# Patient Record
Sex: Male | Born: 1980 | Hispanic: Yes | Smoking: Never smoker
Health system: Southern US, Community
[De-identification: ages and names within clinical notes are randomized; demographics above are authoritative.]

## PROBLEM LIST (undated history)

## (undated) DIAGNOSIS — F32A Depression, unspecified: Secondary | ICD-10-CM

## (undated) DIAGNOSIS — R569 Unspecified convulsions: Secondary | ICD-10-CM

## (undated) DIAGNOSIS — G40909 Epilepsy, unspecified, not intractable, without status epilepticus: Secondary | ICD-10-CM

## (undated) HISTORY — PX: BRAIN SURGERY: SHX531

---

## 2020-07-10 NOTE — Congregational Nurse Program (Signed)
Patient needs assistance with obtaining a medication refill of Vimpat 100mg .He recently moved from [place], [place] a month ago. Shared with him the resource of the mobile medical unit to be seen there and obtain a prescription in state. Pt has medicaid from [place] however this insurance does not cover him here in [place].

## 2020-07-11 ENCOUNTER — Emergency Department (HOSPITAL_BASED_OUTPATIENT_CLINIC_OR_DEPARTMENT_OTHER): Payer: Self-pay

## 2020-07-11 ENCOUNTER — Emergency Department (HOSPITAL_BASED_OUTPATIENT_CLINIC_OR_DEPARTMENT_OTHER)
Admission: EM | Admit: 2020-07-11 | Discharge: 2020-07-12 | Disposition: A | Discharge status: Home / Self Care | Payer: Self-pay | Attending: Emergency Medicine | Admitting: Emergency Medicine

## 2020-07-11 ENCOUNTER — Other Ambulatory Visit: Payer: Self-pay

## 2020-07-11 ENCOUNTER — Encounter (HOSPITAL_BASED_OUTPATIENT_CLINIC_OR_DEPARTMENT_OTHER): Payer: Self-pay | Admitting: *Deleted

## 2020-07-11 DIAGNOSIS — G40909 Epilepsy, unspecified, not intractable, without status epilepticus: Secondary | ICD-10-CM

## 2020-07-11 DIAGNOSIS — Z760 Encounter for issue of repeat prescription: Secondary | ICD-10-CM

## 2020-07-11 DIAGNOSIS — Z20822 Contact with and (suspected) exposure to covid-19: Secondary | ICD-10-CM | POA: Insufficient documentation

## 2020-07-11 DIAGNOSIS — Q046 Congenital cerebral cysts: Secondary | ICD-10-CM

## 2020-07-11 DIAGNOSIS — Z79899 Other long term (current) drug therapy: Secondary | ICD-10-CM | POA: Insufficient documentation

## 2020-07-11 HISTORY — DX: Depression, unspecified: F32.A

## 2020-07-11 HISTORY — DX: Epilepsy, unspecified, not intractable, without status epilepticus: G40.909

## 2020-07-11 HISTORY — DX: Unspecified convulsions: R56.9

## 2020-07-11 LAB — CBC WITH DIFFERENTIAL/PLATELET
Abs Immature Granulocytes: 0.08 10*3/uL — ABNORMAL HIGH (ref 0.00–0.07)
Basophils Absolute: 0 10*3/uL (ref 0.0–0.1)
Basophils Relative: 0 %
Eosinophils Absolute: 0.2 10*3/uL (ref 0.0–0.5)
Eosinophils Relative: 2 %
HCT: 40 % (ref 39.0–52.0)
Hemoglobin: 12.9 g/dL — ABNORMAL LOW (ref 13.0–17.0)
Immature Granulocytes: 1 %
Lymphocytes Relative: 15 %
Lymphs Abs: 1.6 10*3/uL (ref 0.7–4.0)
MCH: 27.2 pg (ref 26.0–34.0)
MCHC: 32.3 g/dL (ref 30.0–36.0)
MCV: 84.4 fL (ref 80.0–100.0)
Monocytes Absolute: 1 10*3/uL (ref 0.1–1.0)
Monocytes Relative: 10 %
Neutro Abs: 7.9 10*3/uL — ABNORMAL HIGH (ref 1.7–7.7)
Neutrophils Relative %: 72 %
Platelets: 284 10*3/uL (ref 150–400)
RBC: 4.74 MIL/uL (ref 4.22–5.81)
RDW: 12.5 % (ref 11.5–15.5)
WBC: 10.8 10*3/uL — ABNORMAL HIGH (ref 4.0–10.5)
nRBC: 0 % (ref 0.0–0.2)

## 2020-07-11 LAB — BASIC METABOLIC PANEL
Anion gap: 8 (ref 5–15)
BUN: 14 mg/dL (ref 6–20)
CO2: 24 mmol/L (ref 22–32)
Calcium: 8.8 mg/dL — ABNORMAL LOW (ref 8.9–10.3)
Chloride: 103 mmol/L (ref 98–111)
Creatinine, Ser: 1.18 mg/dL (ref 0.61–1.24)
GFR, Estimated: >60 mL/min (ref >60)
Glucose, Bld: 114 mg/dL — ABNORMAL HIGH (ref 70–99)
Potassium: 4 mmol/L (ref 3.5–5.1)
Sodium: 135 mmol/L (ref 135–145)

## 2020-07-11 MED ORDER — LEVETIRACETAM IN NACL 1000 MG/100ML IV SOLN
1000.0000 mg | Freq: Once | INTRAVENOUS | Status: AC
  Administered 2020-07-11: 1000 mg via INTRAVENOUS
  Filled 2020-07-11: qty 100

## 2020-07-11 MED ORDER — LORAZEPAM 2 MG/ML IJ SOLN
INTRAMUSCULAR | Status: AC
  Administered 2020-07-11: 1 mg via INTRAVENOUS
  Filled 2020-07-11: qty 1

## 2020-07-11 MED ORDER — VIMPAT 100 MG PO TABS: 1.0000 | ORAL_TABLET | Freq: Two times a day (BID) | ORAL | 0 refills | Status: DC

## 2020-07-11 MED ORDER — LORAZEPAM 2 MG/ML IJ SOLN: 1.0000 mg | Freq: Once | INTRAMUSCULAR | Status: AC

## 2020-07-11 MED ORDER — LEVETIRACETAM 1000 MG PO TABS: 1000.0000 mg | ORAL_TABLET | Freq: Two times a day (BID) | ORAL | 0 refills | Status: DC

## 2020-07-11 NOTE — ED Notes (Signed)
Family calls nurse to room. Pt with seizure activity. Dr. Nicanor Alcon to room. RT at bedside. Respirations assisted with non-rebreather mask.

## 2020-07-11 NOTE — ED Triage Notes (Addendum)
Using I pad  translator Pt requesting refill of medication , seizure x 1 today d/t out of meds x 2 days

## 2020-07-11 NOTE — ED Provider Notes (Signed)
[facility] EMERGENCY DEPARTMENT Provider Note   CSN: [id number] Arrival date & time: 07/11/20  2210     History Chief Complaint  Patient presents with  . Seizures    Omar Robertson is a 39 y.o. male.  The history is provided by the patient. The history is limited by a language barrier. A language interpreter was used.  Seizures Seizure activity on arrival: no   Seizure type:  Grand mal Initial focality:  None Episode characteristics: abnormal movements   Postictal symptoms: no somnolence   Return to baseline: yes   Severity:  Mild Timing:  Once Progression:  Resolved Context: not alcohol withdrawal   Context comment:  Out of his vimpat because [place]  Recent head injury:  No recent head injuries PTA treatment:  None History of seizures: yes        Past Medical History:  Diagnosis Date  . Depression   . Epilepsy (HCC)   . Seizures (HCC)     There are no problems to display for this patient.   History reviewed. No pertinent surgical history.     No family history on file.  Social History   Tobacco Use  . Smoking status: Never Smoker  Substance Use Topics  . Alcohol use: Not Currently  . Drug use: Not Currently    Home Medications Prior to Admission medications   Medication Sig Start Date End Date Taking? Authorizing Provider  escitalopram (LEXAPRO) 20 MG tablet Take 20 mg by mouth daily.   Yes [provider]  lacosamide (VIMPAT) 50 MG TABS tablet Take 100 mg by mouth 2 (two) times daily.   Yes [provider]  levETIRAcetam (KEPPRA) 1000 MG tablet Take 1,000 mg by mouth 2 (two) times daily.   Yes [provider]  Lacosamide (VIMPAT) 100 MG TABS Take 1 tablet (100 mg total) by mouth in the morning and at bedtime. 07/11/20 08/10/20  Lasya Vetter, MD  levETIRAcetam (KEPPRA) 1000 MG tablet Take 1 tablet (1,000 mg total) by mouth 2 (two) times daily. 07/11/20   Anyelin Mogle, MD     Allergies    Patient has no known allergies.  Review of Systems   Review of Systems  Constitutional: Negative for fever.  HENT: Negative for congestion.   Eyes: Negative for visual disturbance.  Respiratory: Negative for shortness of breath.   Cardiovascular: Negative for chest pain.  Gastrointestinal: Negative for abdominal pain.  Genitourinary: Negative for difficulty urinating.  Musculoskeletal: Negative for back pain.  Skin: Negative for rash.  Neurological: Positive for seizures.  Psychiatric/Behavioral: Negative for agitation.  All other systems reviewed and are negative.   Physical Exam Updated Vital Signs BP 114/83 (BP Location: Left Arm)   Pulse 96   Temp 99.3 F (37.4 C) (Oral)   Resp 18   Ht 5' 6.93" (1.7 m)   Wt 76.2 kg   SpO2 98%   BMI 26.37 kg/m   Physical Exam Vitals and nursing note reviewed.  Constitutional:      General: He is not in acute distress.    Appearance: Normal appearance.  HENT:     Head: Normocephalic and atraumatic.     Nose: Nose normal.  Eyes:     Conjunctiva/sclera: Conjunctivae normal.     Pupils: Pupils are equal, round, and reactive to light.  Cardiovascular:     Rate and Rhythm: Normal rate and regular rhythm.     Pulses: Normal pulses.     Heart sounds:  Normal heart sounds.  Pulmonary:     Effort: Pulmonary effort is normal.     Breath sounds: Normal breath sounds.  Abdominal:     General: Abdomen is flat. Bowel sounds are normal.     Palpations: Abdomen is soft.     Tenderness: There is no abdominal tenderness. There is no guarding or rebound.  Musculoskeletal:        General: Normal range of motion.     Cervical back: Normal range of motion and neck supple.  Skin:    General: Skin is warm and dry.     Capillary Refill: Capillary refill takes less than 2 seconds.  Neurological:     General: No focal deficit present.     Mental Status: He is alert and oriented to person, place, and time.     Deep Tendon  Reflexes: Reflexes normal.  Psychiatric:        Mood and Affect: Mood normal.        Behavior: Behavior normal.     ED Results / Procedures / Treatments   Labs (all labs ordered are listed, but only abnormal results are displayed) Labs Reviewed  CBC WITH DIFFERENTIAL/PLATELET  BASIC METABOLIC PANEL    EKG None  Radiology No results found.  Procedures Procedures (including critical care time)  Medications Ordered in ED Medications  levETIRAcetam (KEPPRA) IVPB 1000 mg/100 mL premix (1,000 mg Intravenous New Bag/Given 07/11/20 2323)    ED Course  I have reviewed the triage vital signs and the nursing notes.  Pertinent labs & imaging results that were available during my care of the patient were reviewed by me and considered in my medical decision making (see chart for details).   One seizure witnessed in the ED, lasting 45 second. This broke with ativan.  Now resting comfortably  135 Case d/w Dr.  Derry Lory of neurology.  Restart vompat and refill keppra.  No indication for admission at this time.  Follow up as an outpatient with neurology and no driving for 6 months.     Patient and wife informed no driving for 6 months or until cleared by neurology.  This was printed on discharge in spanish  Omar Robertson was evaluated in Emergency Department on 07/12/2020 for the symptoms described in the history of present illness. He was evaluated in the context of the global COVID-19 pandemic, which necessitated consideration that the patient might be at risk for infection with the SARS-CoV-2 virus that causes COVID-19. Institutional protocols and algorithms that pertain to the evaluation of patients at risk for COVID-19 are in a state of rapid change based on information released by regulatory bodies including the CDC and federal and state organizations. These policies and algorithms were followed during the patient's care in the ED.  MDM Rules/Calculators/A&P                           Omar Robertson was evaluated in Emergency Department on 07/11/2020 for the symptoms described in the history of present illness. He was evaluated in the context of the global COVID-19 pandemic, which necessitated consideration that the patient might be at risk for infection with the SARS-CoV-2 virus that causes COVID-19. Institutional protocols and algorithms that pertain to the evaluation of patients at risk for COVID-19 are in a state of rapid change based on information released by regulatory bodies including the CDC and federal and state organizations. These policies and algorithms were followed during the patient's  care in the ED.  Final Clinical Impression(s) / ED Diagnoses Final diagnoses:  Seizure disorder (HCC)   Return for intractable cough, coughing up blood,fevers >100.4 unrelieved by medication, shortness of breath, intractable vomiting, chest pain, shortness of breath, weakness,numbness, changes in speech, facial asymmetry,abdominal pain, passing out,Inability to tolerate liquids or food, cough, altered mental status or any concerns. No signs of systemic illness or infection. The patient is nontoxic-appearing on exam and vital signs are within normal limits.   I have reviewed the triage vital signs and the nursing notes. Pertinent labs &imaging results that were available during my care of the patient were reviewed by me and considered in my medical decision making (see chart for details).After history, exam, and medical workup I feel the patient has beenappropriately medically screened and is safe for discharge home. Pertinent diagnoses were discussed with the patient. Patient was given return precautions. Rx / DC Orders ED Discharge Orders         Ordered    levETIRAcetam (KEPPRA) 1000 MG tablet  2 times daily        07/11/20 2323    Lacosamide (VIMPAT) 100 MG TABS  2 times daily        07/11/20 2323           Hayly Litsey, MD 07/12/20 [phone]

## 2020-07-11 NOTE — ED Notes (Signed)
Seizure activity stopped after ativan. Mouth suctioned to remove secretions. Pt is postictal at this time.

## 2020-07-12 ENCOUNTER — Emergency Department (HOSPITAL_BASED_OUTPATIENT_CLINIC_OR_DEPARTMENT_OTHER): Payer: Self-pay

## 2020-07-12 LAB — RESPIRATORY PANEL BY RT PCR (FLU A&B, COVID)
Influenza A by PCR: NEGATIVE
Influenza B by PCR: NEGATIVE
SARS Coronavirus 2 by RT PCR: NEGATIVE

## 2020-07-12 NOTE — ED Notes (Signed)
Patient transported to CT 

## 2020-07-12 NOTE — ED Notes (Signed)
Discharge instructions reviewed with patient and significant other. Instructions for medication, follow up care, and no driving for 6 months or until cleared by neurology explained and repeated back. Pt verbalized understanding. Departs ED at this time in stable condition.

## 2020-07-12 NOTE — Discharge Instructions (Signed)
No conducir durante seis menses o hasta que sea despejada por neurologia

## 2020-07-12 NOTE — ED Notes (Signed)
Pt returned to room. PO challenge begun.

## 2020-07-16 ENCOUNTER — Other Ambulatory Visit: Payer: Self-pay

## 2021-01-21 ENCOUNTER — Other Ambulatory Visit: Payer: Self-pay

## 2021-01-21 ENCOUNTER — Ambulatory Visit: Payer: Self-pay | Admitting: Physician Assistant

## 2021-01-21 VITALS — BP 139/85 | HR 65 | Temp 98.8°F | Resp 18 | Ht 71.0 in | Wt 162.0 lb

## 2021-01-21 DIAGNOSIS — Z114 Encounter for screening for human immunodeficiency virus [HIV]: Secondary | ICD-10-CM

## 2021-01-21 DIAGNOSIS — G40802 Other epilepsy, not intractable, without status epilepticus: Secondary | ICD-10-CM

## 2021-01-21 DIAGNOSIS — S069X9S Unspecified intracranial injury with loss of consciousness of unspecified duration, sequela: Secondary | ICD-10-CM

## 2021-01-21 DIAGNOSIS — Z13228 Encounter for screening for other metabolic disorders: Secondary | ICD-10-CM

## 2021-01-21 DIAGNOSIS — Z6822 Body mass index (BMI) 22.0-22.9, adult: Secondary | ICD-10-CM

## 2021-01-21 DIAGNOSIS — F330 Major depressive disorder, recurrent, mild: Secondary | ICD-10-CM

## 2021-01-21 DIAGNOSIS — Z1159 Encounter for screening for other viral diseases: Secondary | ICD-10-CM

## 2021-01-21 MED ORDER — LACOSAMIDE 50 MG PO TABS
ORAL_TABLET | ORAL | 1 refills | Status: DC
  Filled 2021-01-21: qty 30, fill #0

## 2021-01-21 MED ORDER — LEVETIRACETAM 1000 MG PO TABS
1000.0000 mg | ORAL_TABLET | Freq: Two times a day (BID) | ORAL | 1 refills | Status: DC
  Filled 2021-01-21: qty 60, 30d supply, fill #0

## 2021-01-21 MED ORDER — LACOSAMIDE 100 MG PO TABS
1.0000 | ORAL_TABLET | Freq: Two times a day (BID) | ORAL | 0 refills | Status: DC
  Filled 2021-01-21 – 2021-01-31 (×3): qty 60, 30d supply, fill #0

## 2021-01-21 MED ORDER — ESCITALOPRAM OXALATE 20 MG PO TABS
20.0000 mg | ORAL_TABLET | Freq: Every day | ORAL | 2 refills | Status: DC
  Filled 2021-01-21: qty 30, 30d supply, fill #0

## 2021-01-21 NOTE — Progress Notes (Signed)
Patient denies pain at this time. Patient has eaten today. Patient is requesting refills on medication. Patient reports a recent seizure.

## 2021-01-21 NOTE — Patient Instructions (Signed)
We will call you with your lab results.  Please let us know if there is anything else we can do for you.  Roney Jaffe, PA-C Physician Assistant [facility] [email or web address]    Mantenimiento de la salud en los hombres Health Maintenance, Male Adoptar un estilo de vida saludable y recibir atencin preventiva son importantes para promover la salud y Counsellor. Consulte al mdico sobre:  El esquema adecuado para hacerse pruebas y exmenes peridicos.  Cosas que puede hacer por su cuenta para prevenir enfermedades y [place] sano. Qu debo saber sobre la dieta, el peso y el ejercicio? Consuma una dieta saludable  Consuma una dieta que incluya muchas verduras, frutas, productos lcteos con bajo contenido de [place] y Associate Professor.  No consuma muchos alimentos ricos en grasas slidas, azcares agregados o sodio.   Mantenga un peso saludable El ndice de masa muscular [facility]) es una medida que puede utilizarse para identificar posibles problemas de [place]. Proporciona una estimacin de la grasa corporal basndose en el peso y la altura. Su mdico puede ayudarle a Engineer, site [facility] y a Personnel officer o Pharmacologist un peso saludable. Haga ejercicio con regularidad Haga ejercicio con regularidad. Esta es una de las prcticas ms importantes que puede hacer por su salud. La mayora de los adultos deben seguir estas pautas:  Education officer, environmental, al menos, de actividad fsica por semana. El ejercicio debe aumentar la frecuencia cardaca y Media planner transpirar (ejercicio de intensidad moderada).  Hacer ejercicios de fortalecimiento por lo [employer] por semana. Agregue esto a su plan de ejercicio de intensidad moderada.  Pasar menos tiempo sentados. Incluso la actividad fsica ligera puede ser beneficiosa. Controle sus niveles de colesterol y lpidos en la sangre Comience a realizarse anlisis de lpidos y Oncologist en la sangre a los  20aos y luego reptalos cada 5aos. Es posible que Insurance underwriter los niveles de colesterol con mayor frecuencia si:  Sus niveles de lpidos y colesterol son altos.  Es mayor de 40aos.  Presenta un alto riesgo de padecer enfermedades cardacas. Qu debo saber sobre las pruebas de deteccin del cncer? Muchos tipos de cncer pueden detectarse de manera temprana y, a menudo, pueden prevenirse. Segn su historia clnica y sus antecedentes familiares, es posible que deba realizarse pruebas de deteccin del cncer en diferentes edades. Esto puede incluir pruebas de deteccin de lo siguiente:  Building services engineer.  Cncer de prstata.  Cncer de piel.  Cncer de pulmn. Qu debo saber sobre la enfermedad cardaca, la diabetes y la hipertensin arterial? Presin arterial y enfermedad cardaca  La hipertensin arterial causa enfermedades cardacas y [place] el riesgo de accidente cerebrovascular. Es ms probable que esto se manifieste en las personas que tienen lecturas de presin arterial alta, tienen ascendencia africana o tienen sobrepeso.  Hable con el mdico sobre sus valores de presin arterial deseados.  Hgase controlar la presin arterial: ? Cada 3 a 5 aos si tiene entre 18 y 18 aos. ? Todos los aos si es mayor de [place].  Si tiene entre 65 y 87 aos y es fumador o Insurance underwriter, pregntele al mdico si debe realizarse una prueba de deteccin de aneurisma artico abdominal (AAA) por nica vez. Diabetes Realcese exmenes de deteccin de la diabetes con regularidad. Este anlisis revisa el nivel de azcar en la sangre en [place]. Hgase las pruebas de deteccin:  Cada tresaos despus de los 45aos de edad si tiene un peso normal y un bajo riesgo de padecer diabetes.  Con ms frecuencia  frecuencia y a partir de una edad inferior si tiene sobrepeso o un alto riesgo de padecer diabetes. Qu debo saber sobre la prevencin de infecciones? Hepatitis B Si tiene un riesgo ms alto  de contraer hepatitis B, debe someterse a un examen de deteccin de este virus. Hable con el mdico para averiguar si tiene riesgo de contraer la infeccin por hepatitis B. Hepatitis C Se recomienda un anlisis de sangre para:  Todos los que nacieron entre 1945 y 1965.  Todas las personas que tengan un riesgo de haber contrado hepatitis C. Enfermedades de transmisin sexual (ETS)  Debe realizarse pruebas de deteccin de ITS todos los aos, incluidas la gonorrea y la clamidia, si: ? Es sexualmente activo y es menor de 24aos. ? Es mayor de 24aos, y el mdico le informa que corre riesgo de tener este tipo de infecciones. ? La actividad sexual ha cambiado desde que le hicieron la ltima prueba de deteccin y tiene un riesgo mayor de tener clamidia o gonorrea. Pregntele al mdico si usted tiene riesgo.  Pregntele al mdico si usted tiene un alto riesgo de contraer VIH. El mdico tambin puede recomendarle un medicamento recetado para ayudar a evitar la infeccin por el VIH. Si elige tomar medicamentos para prevenir el VIH, primero debe hacerse los anlisis de deteccin del VIH. Luego debe hacerse anlisis cada 3meses mientras est tomando los medicamentos. Siga estas instrucciones en su casa: Estilo de vida  No consuma ningn producto que contenga nicotina o tabaco, como cigarrillos, cigarrillos electrnicos y tabaco de mascar. Si necesita ayuda para dejar de fumar, consulte al mdico.  No consuma drogas.  No comparta agujas.  Solicite ayuda a su mdico si necesita apoyo o informacin para abandonar las drogas. Consumo de alcohol  No beba alcohol si el mdico se lo prohbe.  Si bebe alcohol: ? Limite la cantidad que consume de 0 a 2 medidas por da. ? Est atento a la cantidad de alcohol que hay en las bebidas que toma. En los [place], una medida equivale a una botella de cerveza de 12oz (355ml), un vaso de vino de 5oz (148ml) o un vaso de una bebida alcohlica de alta  graduacin de 1oz (44ml). Instrucciones generales  Realcese los estudios de rutina de la salud, dentales y de la vista.  Mantngase al da con las vacunas.  Infrmele a su mdico si: ? Se siente deprimido con frecuencia. ? Alguna vez ha sido vctima de maltrato o no se siente seguro en su casa. Resumen  Adoptar un estilo de vida saludable y recibir atencin preventiva son importantes para promover la salud y el bienestar.  Siga las instrucciones del mdico acerca de una dieta saludable, el ejercicio y la realizacin de pruebas o exmenes para detectar enfermedades.  Siga las instrucciones del mdico con respecto al control del colesterol y la presin arterial. Esta informacin no tiene como fin reemplazar el consejo del mdico. Asegrese de hacerle al mdico cualquier pregunta que tenga. Document Revised: 09/08/2018 Document Reviewed: 09/08/2018 Elsevier Patient Education  2021 [employer]vier Inc.    
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
## 2021-01-22 DIAGNOSIS — G40909 Epilepsy, unspecified, not intractable, without status epilepticus: Secondary | ICD-10-CM | POA: Insufficient documentation

## 2021-01-22 DIAGNOSIS — F330 Major depressive disorder, recurrent, mild: Secondary | ICD-10-CM | POA: Insufficient documentation

## 2021-01-22 DIAGNOSIS — Z6822 Body mass index (BMI) 22.0-22.9, adult: Secondary | ICD-10-CM | POA: Insufficient documentation

## 2021-01-22 DIAGNOSIS — S069X9A Unspecified intracranial injury with loss of consciousness of unspecified duration, initial encounter: Secondary | ICD-10-CM | POA: Insufficient documentation

## 2021-01-22 HISTORY — DX: Unspecified intracranial injury with loss of consciousness of unspecified duration, initial encounter: S06.9X9A

## 2021-01-22 LAB — COMP. METABOLIC PANEL (12)
AST: 17 IU/L (ref 0–40)
Albumin/Globulin Ratio: 1.6 (ref 1.2–2.2)
Albumin: 5.3 g/dL — ABNORMAL HIGH (ref 4.0–5.0)
Alkaline Phosphatase: 73 IU/L (ref 44–121)
BUN/Creatinine Ratio: 13 (ref 9–20)
BUN: 13 mg/dL (ref 6–24)
Bilirubin Total: 0.2 mg/dL (ref 0.0–1.2)
Calcium: 9.9 mg/dL (ref 8.7–10.2)
Chloride: 102 mmol/L (ref 96–106)
Creatinine, Ser: 0.99 mg/dL (ref 0.76–1.27)
Globulin, Total: 3.3 g/dL (ref 1.5–4.5)
Glucose: 99 mg/dL (ref 65–99)
Potassium: 4.6 mmol/L (ref 3.5–5.2)
Sodium: 142 mmol/L (ref 134–144)
Total Protein: 8.6 g/dL — ABNORMAL HIGH (ref 6.0–8.5)
eGFR: 98 mL/min/{1.73_m2} (ref >59)

## 2021-01-22 LAB — CBC WITH DIFFERENTIAL/PLATELET
Basophils Absolute: 0 10*3/uL (ref 0.0–0.2)
Basos: 0 %
EOS (ABSOLUTE): 0.1 10*3/uL (ref 0.0–0.4)
Eos: 1 %
Hematocrit: 45.3 % (ref 37.5–51.0)
Hemoglobin: 14.3 g/dL (ref 13.0–17.7)
Immature Grans (Abs): 0 10*3/uL (ref 0.0–0.1)
Immature Granulocytes: 0 %
Lymphocytes Absolute: 2.6 10*3/uL (ref 0.7–3.1)
Lymphs: 36 %
MCH: 27 pg (ref 26.6–33.0)
MCHC: 31.6 g/dL (ref 31.5–35.7)
MCV: 86 fL (ref 79–97)
Monocytes Absolute: 0.6 10*3/uL (ref 0.1–0.9)
Monocytes: 7 %
Neutrophils Absolute: 4.1 10*3/uL (ref 1.4–7.0)
Neutrophils: 56 %
Platelets: 280 10*3/uL (ref 150–450)
RBC: 5.3 x10E6/uL (ref 4.14–5.80)
RDW: 13.6 % (ref 11.6–15.4)
WBC: 7.4 10*3/uL (ref 3.4–10.8)

## 2021-01-22 LAB — LIPID PANEL
Chol/HDL Ratio: 4.6 ratio (ref 0.0–5.0)
Cholesterol, Total: 217 mg/dL — ABNORMAL HIGH (ref 100–199)
HDL: 47 mg/dL (ref >39)
LDL Chol Calc (NIH): 146 mg/dL — ABNORMAL HIGH (ref 0–99)
Triglycerides: 133 mg/dL (ref 0–149)
VLDL Cholesterol Cal: 24 mg/dL (ref 5–40)

## 2021-01-22 LAB — TSH: TSH: 1.32 u[IU]/mL (ref 0.450–4.500)

## 2021-01-22 LAB — HCV AB W REFLEX TO QUANT PCR: HCV Ab: <0.1 s/co ratio (ref 0.0–0.9)

## 2021-01-22 LAB — HCV INTERPRETATION

## 2021-01-22 LAB — HIV ANTIBODY (ROUTINE TESTING W REFLEX): HIV Screen 4th Generation wRfx: NONREACTIVE

## 2021-01-24 ENCOUNTER — Telehealth: Payer: Self-pay | Admitting: *Deleted

## 2021-01-24 ENCOUNTER — Other Ambulatory Visit: Payer: Self-pay

## 2021-01-24 NOTE — Telephone Encounter (Signed)
Error - duplicate

## 2021-01-24 NOTE — Telephone Encounter (Signed)
Medical Assistant used [employer] to contact patient.  Interpreter Name: Marylynn Pearson Interpreter #: [id number] Patient verified DOB Patient is aware of thyroid and kidney being normal with liver enzymes needing to be monitored. Patient is aware of needing to take his cholesterol medication for elevated levels with a recheck being completed at the next visit along with liver enzymes.

## 2021-01-24 NOTE — Telephone Encounter (Signed)
Interpreter Name: Marylynn Pearson Interpreter #: [id number] Medical Assistant used [employer] to contact patient.  Patient was not available, [employer] left patient a voicemail. Voicemail states to give a call back to [place] with [facility] AT [phone].

## 2021-01-24 NOTE — Telephone Encounter (Signed)
-----   Message from Roney Jaffe, [place] sent at 01/22/2021 11:56 AM EDT ----- Please call patient and let him know that his thyroid function and kidney function are within normal limits.  He does have a liver marker that is slightly elevated, this will continue to be monitored.  His screening for hepatitis C and HIV were negative, he does not show signs of anemia. His cholesterol is elevated, he does not need to start medication at this time, however it is very important that he follow a low-cholesterol diet and continue to have these levels monitored.  The 10-year ASCVD risk score Denman George DC Montez Hageman., et al., 2013) is: 1.9%   Values used to calculate the score:     Age: 40 years     Sex: Male     Is Non-Hispanic African American: No     Diabetic: No     Tobacco smoker: No     Systolic Blood Pressure: 139 mmHg     Is BP treated: No     HDL Cholesterol: 47 mg/dL     Total Cholesterol: 217 mg/dL

## 2021-01-25 NOTE — Telephone Encounter (Signed)
Rx needs to be sent to another retail pharmacy. We cannot fill because it is controlled.

## 2021-01-28 ENCOUNTER — Emergency Department (HOSPITAL_BASED_OUTPATIENT_CLINIC_OR_DEPARTMENT_OTHER): Payer: Self-pay

## 2021-01-28 ENCOUNTER — Other Ambulatory Visit: Payer: Self-pay

## 2021-01-28 ENCOUNTER — Inpatient Hospital Stay (HOSPITAL_BASED_OUTPATIENT_CLINIC_OR_DEPARTMENT_OTHER)
Admission: EM | Admit: 2021-01-28 | Discharge: 2021-01-31 | DRG: 101 | Disposition: A | Discharge status: Home / Self Care | Payer: Self-pay | Attending: Internal Medicine | Admitting: Internal Medicine

## 2021-01-28 ENCOUNTER — Encounter (HOSPITAL_BASED_OUTPATIENT_CLINIC_OR_DEPARTMENT_OTHER): Payer: Self-pay | Admitting: *Deleted

## 2021-01-28 DIAGNOSIS — Y92009 Unspecified place in unspecified non-institutional (private) residence as the place of occurrence of the external cause: Secondary | ICD-10-CM

## 2021-01-28 DIAGNOSIS — T50906A Underdosing of unspecified drugs, medicaments and biological substances, initial encounter: Secondary | ICD-10-CM | POA: Diagnosis present

## 2021-01-28 DIAGNOSIS — Z938 Other artificial opening status: Secondary | ICD-10-CM

## 2021-01-28 DIAGNOSIS — G40909 Epilepsy, unspecified, not intractable, without status epilepticus: Principal | ICD-10-CM

## 2021-01-28 DIAGNOSIS — S069X9A Unspecified intracranial injury with loss of consciousness of unspecified duration, initial encounter: Secondary | ICD-10-CM | POA: Diagnosis present

## 2021-01-28 DIAGNOSIS — Z79899 Other long term (current) drug therapy: Secondary | ICD-10-CM

## 2021-01-28 DIAGNOSIS — Z91128 Patient's intentional underdosing of medication regimen for other reason: Secondary | ICD-10-CM

## 2021-01-28 DIAGNOSIS — J9383 Other pneumothorax: Secondary | ICD-10-CM | POA: Diagnosis present

## 2021-01-28 DIAGNOSIS — J939 Pneumothorax, unspecified: Secondary | ICD-10-CM

## 2021-01-28 DIAGNOSIS — G40802 Other epilepsy, not intractable, without status epilepticus: Secondary | ICD-10-CM

## 2021-01-28 DIAGNOSIS — S069X9S Unspecified intracranial injury with loss of consciousness of unspecified duration, sequela: Secondary | ICD-10-CM

## 2021-01-28 DIAGNOSIS — Z20822 Contact with and (suspected) exposure to covid-19: Secondary | ICD-10-CM | POA: Diagnosis present

## 2021-01-28 HISTORY — DX: Pneumothorax, unspecified: J93.9

## 2021-01-28 HISTORY — DX: Unspecified convulsions: R56.9

## 2021-01-28 LAB — CBC WITH DIFFERENTIAL/PLATELET
Abs Immature Granulocytes: 0.11 10*3/uL — ABNORMAL HIGH (ref 0.00–0.07)
Basophils Absolute: 0.1 10*3/uL (ref 0.0–0.1)
Basophils Relative: 0 %
Eosinophils Absolute: 0 10*3/uL (ref 0.0–0.5)
Eosinophils Relative: 0 %
HCT: 47.8 % (ref 39.0–52.0)
Hemoglobin: 15.4 g/dL (ref 13.0–17.0)
Immature Granulocytes: 1 %
Lymphocytes Relative: 13 %
Lymphs Abs: 2.3 10*3/uL (ref 0.7–4.0)
MCH: 27.9 pg (ref 26.0–34.0)
MCHC: 32.2 g/dL (ref 30.0–36.0)
MCV: 86.6 fL (ref 80.0–100.0)
Monocytes Absolute: 2.1 10*3/uL — ABNORMAL HIGH (ref 0.1–1.0)
Monocytes Relative: 12 %
Neutro Abs: 12.2 10*3/uL — ABNORMAL HIGH (ref 1.7–7.7)
Neutrophils Relative %: 74 %
Platelets: 319 10*3/uL (ref 150–400)
RBC: 5.52 MIL/uL (ref 4.22–5.81)
RDW: 12.9 % (ref 11.5–15.5)
WBC: 16.7 10*3/uL — ABNORMAL HIGH (ref 4.0–10.5)
nRBC: 0 % (ref 0.0–0.2)

## 2021-01-28 LAB — COMPREHENSIVE METABOLIC PANEL
ALT: 18 U/L (ref 0–44)
AST: 21 U/L (ref 15–41)
Albumin: 5.2 g/dL — ABNORMAL HIGH (ref 3.5–5.0)
Alkaline Phosphatase: 59 U/L (ref 38–126)
Anion gap: 11 (ref 5–15)
BUN: 20 mg/dL (ref 6–20)
CO2: 20 mmol/L — ABNORMAL LOW (ref 22–32)
Calcium: 9.7 mg/dL (ref 8.9–10.3)
Chloride: 107 mmol/L (ref 98–111)
Creatinine, Ser: 1.55 mg/dL — ABNORMAL HIGH (ref 0.61–1.24)
GFR, Estimated: 57 mL/min — ABNORMAL LOW (ref >60)
Glucose, Bld: 119 mg/dL — ABNORMAL HIGH (ref 70–99)
Potassium: 4 mmol/L (ref 3.5–5.1)
Sodium: 138 mmol/L (ref 135–145)
Total Bilirubin: 0.5 mg/dL (ref 0.3–1.2)
Total Protein: 9.4 g/dL — ABNORMAL HIGH (ref 6.5–8.1)

## 2021-01-28 LAB — MAGNESIUM: Magnesium: 2.5 mg/dL — ABNORMAL HIGH (ref 1.7–2.4)

## 2021-01-28 LAB — RESP PANEL BY RT-PCR (FLU A&B, COVID) ARPGX2
Influenza A by PCR: NEGATIVE
Influenza B by PCR: NEGATIVE
SARS Coronavirus 2 by RT PCR: NEGATIVE

## 2021-01-28 LAB — ETHANOL: Alcohol, Ethyl (B): <10 mg/dL (ref <10)

## 2021-01-28 LAB — CBG MONITORING, ED: Glucose-Capillary: 111 mg/dL — ABNORMAL HIGH (ref 70–99)

## 2021-01-28 MED ORDER — LACOSAMIDE 50 MG PO TABS
150.0000 mg | ORAL_TABLET | Freq: Every day | ORAL | Status: DC
  Administered 2021-01-29 – 2021-01-31 (×3): 150 mg via ORAL
  Filled 2021-01-28 (×3): qty 3

## 2021-01-28 MED ORDER — LEVETIRACETAM 500 MG PO TABS
1000.0000 mg | ORAL_TABLET | Freq: Two times a day (BID) | ORAL | Status: DC
  Administered 2021-01-29 – 2021-01-31 (×5): 1000 mg via ORAL
  Filled 2021-01-28 (×5): qty 2

## 2021-01-28 MED ORDER — LORAZEPAM 2 MG/ML IJ SOLN: 2.0000 mg | Freq: Four times a day (QID) | INTRAMUSCULAR | Status: DC | PRN

## 2021-01-28 MED ORDER — LACOSAMIDE 50 MG PO TABS: 50.0000 mg | ORAL_TABLET | Freq: Every day | ORAL | Status: DC

## 2021-01-28 MED ORDER — LORAZEPAM 2 MG/ML IJ SOLN
1.0000 mg | Freq: Once | INTRAMUSCULAR | Status: DC
  Filled 2021-01-28: qty 1

## 2021-01-28 MED ORDER — ACETAMINOPHEN 650 MG RE SUPP: 650.0000 mg | Freq: Four times a day (QID) | RECTAL | Status: DC | PRN

## 2021-01-28 MED ORDER — ESCITALOPRAM OXALATE 20 MG PO TABS
20.0000 mg | ORAL_TABLET | Freq: Every day | ORAL | Status: DC
  Administered 2021-01-29 – 2021-01-31 (×3): 20 mg via ORAL
  Filled 2021-01-28 (×3): qty 1

## 2021-01-28 MED ORDER — LACOSAMIDE 50 MG PO TABS
50.0000 mg | ORAL_TABLET | Freq: Every day | ORAL | Status: DC
Start: 2021-01-29 — End: 2021-01-28

## 2021-01-28 MED ORDER — LACOSAMIDE 50 MG PO TABS
100.0000 mg | ORAL_TABLET | Freq: Two times a day (BID) | ORAL | Status: DC
  Administered 2021-01-28: 100 mg via ORAL
  Filled 2021-01-28 (×2): qty 2

## 2021-01-28 MED ORDER — LACOSAMIDE 50 MG PO TABS
100.0000 mg | ORAL_TABLET | Freq: Every day | ORAL | Status: DC
  Administered 2021-01-29 – 2021-01-30 (×2): 100 mg via ORAL
  Filled 2021-01-28 (×2): qty 2

## 2021-01-28 MED ORDER — LEVETIRACETAM 500 MG PO TABS: 1000.0000 mg | ORAL_TABLET | Freq: Two times a day (BID) | ORAL | Status: DC

## 2021-01-28 MED ORDER — ACETAMINOPHEN 325 MG PO TABS
650.0000 mg | ORAL_TABLET | Freq: Four times a day (QID) | ORAL | Status: DC | PRN
  Administered 2021-01-29: 650 mg via ORAL
  Filled 2021-01-28: qty 2

## 2021-01-28 MED ORDER — ONDANSETRON HCL 4 MG PO TABS: 4.0000 mg | ORAL_TABLET | Freq: Four times a day (QID) | ORAL | Status: DC | PRN

## 2021-01-28 MED ORDER — ENOXAPARIN SODIUM 40 MG/0.4ML IJ SOSY
40.0000 mg | PREFILLED_SYRINGE | INTRAMUSCULAR | Status: DC
  Administered 2021-01-29 – 2021-01-31 (×3): 40 mg via SUBCUTANEOUS
  Filled 2021-01-28 (×3): qty 0.4

## 2021-01-28 MED ORDER — LACOSAMIDE 50 MG PO TABS: 100.0000 mg | ORAL_TABLET | Freq: Two times a day (BID) | ORAL | Status: DC

## 2021-01-28 MED ORDER — LEVETIRACETAM IN NACL 1000 MG/100ML IV SOLN
1000.0000 mg | Freq: Once | INTRAVENOUS | Status: AC
  Administered 2021-01-29: 1000 mg via INTRAVENOUS
  Filled 2021-01-28 (×2): qty 100

## 2021-01-28 MED ORDER — SODIUM CHLORIDE 0.9 % IV SOLN
100.0000 mg | Freq: Once | INTRAVENOUS | Status: AC
  Administered 2021-01-29: 100 mg via INTRAVENOUS
  Filled 2021-01-28: qty 10

## 2021-01-28 MED ORDER — ONDANSETRON HCL 4 MG/2ML IJ SOLN: 4.0000 mg | Freq: Four times a day (QID) | INTRAMUSCULAR | Status: DC | PRN

## 2021-01-28 MED ORDER — LORAZEPAM 2 MG/ML IJ SOLN
1.0000 mg | Freq: Once | INTRAMUSCULAR | Status: AC
  Administered 2021-01-28: 1 mg via INTRAVENOUS

## 2021-01-28 NOTE — ED Provider Notes (Signed)
[facility] EMERGENCY DEPARTMENT Provider Note   CSN: [id number] Arrival date & time: 01/28/21  1759     History Chief Complaint  Patient presents with  . Altered Mental Status    Hulan FessJandrel A Majano is a 40 y.o. male w/ pmhx of brain cyst s/p surgical removal (?) presenting to emergency department company of his wife with concern for seizures.  The patient is primarily Spanish-speaking, translator was used.  History is supplemented by his wife.  She reports that the patient has a history of seizures for many years, is normally on Keppra 1000 mg twice daily as well as Vimpat 100 mg twice daily.  He ran out of Vimpat 3 days ago.  He has been having increasing seizures since running out of the medications.  She reports has been having a seizure every 5 hours for the past 3 days.  Most recent seizure was approximately 1 hour prior to arrival.  These will last about 3 to 4 minutes with generalized tonic-clonic jerking, with a postictal state.  Patient is currently back to his baseline mental status.  He has no acute complaints.  He denies headache.  He denies urinary incontinence or biting his tongue.  He denies muscle aches.  His wife also reports concerns that he has been behaving erratically.  She reports he does have a history of mental health problems, but this is stabilized on the Vimpat.  The patient currently denies any thoughts of harming himself, any suicidal ideations.  [place] are [place].  They moved here several months ago to be closer to other family.  They do not currently have a neurologist here.  HPI     Past Medical History:  Diagnosis Date  . Depression   . Epilepsy (HCC)   . Seizure (HCC)   . Seizures [facility](HCC)     Patient Active Problem List   Diagnosis Date Noted  . Recurrent seizures (HCC) 01/28/2021  . Pneumothorax 01/28/2021  . Epilepsy (HCC) 01/22/2021  . Traumatic brain injury with loss of consciousness (HCC) 01/22/2021  . Mild episode of  recurrent major depressive disorder (HCC) 01/22/2021  . Body mass index (BMI) 22.0-22.9, adult 01/22/2021    Past Surgical History:  Procedure Laterality Date  . BRAIN SURGERY         Family History  Problem Relation Age of Onset  . Seizures Neg Hx     Social History   Tobacco Use  . Smoking status: Never Smoker  . Smokeless tobacco: Never Used  Substance Use Topics  . Alcohol use: Not Currently  . Drug use: Not Currently    Home Medications Prior to Admission medications   Medication Sig Start Date End Date Taking? Authorizing Provider  escitalopram (LEXAPRO) 20 MG tablet Take 1 tablet (20 mg total) by mouth daily. 01/21/21   Mayers, Cari S, PA-C  Lacosamide (VIMPAT) 100 MG TABS Take 1 tablet (100 mg total) by mouth in the morning and at bedtime. 01/21/21 02/20/21  Mayers, Kasandra Knudsenari S, PA-C  lacosamide (VIMPAT) 50 MG TABS tablet Take 1 tab PO qAM along with 100 mg tab once daily 01/21/21   Mayers, Cari S, PA-C  levETIRAcetam (KEPPRA) 1000 MG tablet Take 1 tablet (1,000 mg total) by mouth 2 (two) times daily. 01/21/21   Mayers, Cari S, PA-C    Allergies    Patient has no known allergies.  Review of Systems   Review of Systems  Constitutional: Negative for chills and fever.  Eyes: Negative for pain  and visual disturbance.  Respiratory: Negative for cough and shortness of breath.   Cardiovascular: Positive for chest pain. Negative for palpitations.  Gastrointestinal: Negative for abdominal pain and vomiting.  Genitourinary: Negative for dysuria and hematuria.  Musculoskeletal: Negative for arthralgias and myalgias.  Skin: Negative for color change and rash.  Neurological: Positive for seizures. Negative for syncope, light-headedness and headaches.  All other systems reviewed and are negative.   Physical Exam Updated Vital Signs BP (!) 143/86 (BP Location: Right Arm)   Pulse 76   Temp 98.2 F (36.8 C) (Oral)   Resp 18   Ht 5\' 11"  (1.803 m)   Wt 72.6 kg   SpO2 98%    BMI 22.32 kg/m   Physical Exam Constitutional:      General: He is not in acute distress. HENT:     Head: Normocephalic and atraumatic.  Eyes:     Conjunctiva/sclera: Conjunctivae normal.     Pupils: Pupils are equal, round, and reactive to light.  Cardiovascular:     Rate and Rhythm: Regular rhythm. Tachycardia present.     Comments: HR 100 bpm Pulmonary:     Effort: Pulmonary effort is normal. No respiratory distress.     Breath sounds: No wheezing.     Comments: Diminished breath sounds in left apex 99% on room air Chest:     Chest wall: No tenderness.  Abdominal:     General: There is no distension.     Tenderness: There is no abdominal tenderness.  Skin:    General: Skin is warm and dry.  Neurological:     General: No focal deficit present.     Mental Status: He is alert and oriented to person, place, and time. Mental status is at baseline.     Sensory: No sensory deficit.     Motor: No weakness.     ED Results / Procedures / Treatments   Labs (all labs ordered are listed, but only abnormal results are displayed) Labs Reviewed  CBC WITH DIFFERENTIAL/PLATELET - Abnormal; Notable for the following components:      Result Value   WBC 16.7 (*)    Neutro Abs 12.2 (*)    Monocytes Absolute 2.1 (*)    Abs Immature Granulocytes 0.11 (*)    All other components within normal limits  MAGNESIUM - Abnormal; Notable for the following components:   Magnesium 2.5 (*)    All other components within normal limits  COMPREHENSIVE METABOLIC PANEL - Abnormal; Notable for the following components:   CO2 20 (*)    Glucose, Bld 119 (*)    Creatinine, Ser 1.55 (*)    Total Protein 9.4 (*)    Albumin 5.2 (*)    GFR, Estimated 57 (*)    All other components within normal limits  BASIC METABOLIC PANEL - Abnormal; Notable for the following components:   Glucose, Bld 109 (*)    All other components within normal limits  CBG MONITORING, ED - Abnormal; Notable for the following  components:   Glucose-Capillary 111 (*)    All other components within normal limits  RESP PANEL BY RT-PCR (FLU A&B, COVID) ARPGX2  ETHANOL  URINALYSIS, ROUTINE W REFLEX MICROSCOPIC  RAPID URINE DRUG SCREEN, HOSP PERFORMED    EKG None  Radiology DG CHEST PORT 1 VIEW  Result Date: 01/29/2021 CLINICAL DATA:  Pneumothorax. EXAM: PORTABLE CHEST 1 VIEW COMPARISON:  01/28/2021. FINDINGS: Mediastinum hilar structures normal. Stable cardiomegaly. Low lung volumes with bibasilar atelectasis. No pleural effusion. Moderate left-sided  pneumothorax is again noted without interim change. IMPRESSION: 1. Moderate left-sided pneumothorax again noted without interim change. 2.  Low lung volumes with bibasilar atelectasis. 3.  Stable cardiomegaly. Electronically Signed   By: Maisie Fus  Register   On: 01/29/2021 07:15   DG Chest Portable 1 View  Result Date: 01/28/2021 CLINICAL DATA:  Repeat radiograph, assess left-sided pneumothorax EXAM: PORTABLE CHEST 1 VIEW COMPARISON:  01/28/2021, 6:38 p.m. FINDINGS: The heart size and mediastinal contours are within normal limits. Unchanged small to moderate left pneumothorax, approximately 20% volume. The right lung is normally aerated. The visualized skeletal structures are unremarkable. IMPRESSION: Unchanged small to moderate left pneumothorax, approximately 20% volume. The right lung is normally aerated. Electronically Signed   By: Lauralyn Primes M.D.   On: 01/28/2021 20:41   DG Chest Port 1 View  Result Date: 01/28/2021 CLINICAL DATA:  Recent seizure activity EXAM: PORTABLE CHEST 1 VIEW COMPARISON:  07/11/2020 FINDINGS: Cardiac shadow is within normal limits. Right lung is well aerated. Left lung demonstrates a moderate-sized pneumothorax with approximately 4.8 cm of excursion from the apex. No focal infiltrate is seen. Mild atelectasis is noted in the left base. No bony abnormality is noted. IMPRESSION: New left pneumothorax as described. Critical Value/emergent results  were called by telephone at the time of interpretation on 01/28/2021 at 7:25 pm to Dr. Alvester Chou , who verbally acknowledged these results. Electronically Signed   By: Alcide Clever M.D.   On: 01/28/2021 19:25    Procedures Procedures   Medications Ordered in ED Medications  escitalopram (LEXAPRO) tablet 20 mg (20 mg Oral Given 01/29/21 0906)  LORazepam (ATIVAN) injection 2 mg (has no administration in time range)  levETIRAcetam (KEPPRA) tablet 1,000 mg (1,000 mg Oral Given 01/29/21 0906)  lacosamide (VIMPAT) tablet 150 mg (150 mg Oral Given 01/29/21 0905)  lacosamide (VIMPAT) tablet 100 mg (has no administration in time range)  enoxaparin (LOVENOX) injection 40 mg (40 mg Subcutaneous Given 01/29/21 0905)  acetaminophen (TYLENOL) tablet 650 mg (650 mg Oral Given 01/29/21 0129)    Or  acetaminophen (TYLENOL) suppository 650 mg ( Rectal See Alternative 01/29/21 0129)  ondansetron (ZOFRAN) tablet 4 mg (has no administration in time range)    Or  ondansetron (ZOFRAN) injection 4 mg (has no administration in time range)  LORazepam (ATIVAN) injection 1 mg (1 mg Intravenous Given 01/28/21 2022)  levETIRAcetam (KEPPRA) IVPB 1000 mg/100 mL premix (1,000 mg Intravenous New Bag/Given 01/29/21 0225)  lacosamide (VIMPAT) 100 mg in sodium chloride 0.9 % 25 mL IVPB (100 mg Intravenous New Bag/Given 01/29/21 0042)    ED Course  I have reviewed the triage vital signs and the nursing notes.  Pertinent labs & imaging results that were available during my care of the patient were reviewed by me and considered in my medical decision making (see chart for details).  40 yo male here with recurrent seizures in the setting of running out of Vimpat, his long term AED.  He has continued on Keppra 1000 mg BID per his wife's report.  He has a history of "brain surgery for cysts" with CT on 07/12/20 showing post-surgical changes in the right frontal lobe.  This may be contributing to his epilepsy.  There is no reported  head injury or headache today to suggest new brain lesion or ICH as cause of his seizures.  I suspect the seizures are likely related to his medication.  I've ordered vimpat, but we do not have it readily available in our ED, and it will  need to be brought over from [facility].  He took his Keppra this morning.  I performed an infectious w/u to evaluate for other causes of seizures.  No clear nidus of infection in urine or dg chest.  He reports he doesn't think etoh and his levels are negative today.  UDS is pending.  DG chest showed a small left sided PTX.  He has been stable on room air since arrival.  Supplemental O2 provided for therapeutic purposes only, not hypoxia.  Repeat dg chest showed stable PTX.  Unclear if this was traumatic from his seizing or spontaneous or a bleb.  He does appear stable at this time, and I believe this would be amenable for IR management in the hospital as the safest route of treatment, given the relative small size of this PTX.  IV ativan given for anxiety in the ED.  Labs reviewed - Cr elevated to 1.55.  Covid/flu negative.  Mg 2.5.  WBC 16.7.  Spanish translator services used for entire history and exam.  Clinical Course as of 01/29/21 0918  Mon Jan 28, 2021  1927 Left sided PTX noted - I've asked nursing to apply supplemental oxygen.  Patient has no chest pain, no hypotension, no shortness of breath. [MT]  2106 Repeat chest x-ray shows no interval enlargement of the pneumothorax, which remains small to moderate sized at approximately 20%.  He has been placed on supplemental oxygen for supportive care only for the pneumothorax, not for hypoxia or respiratory complaints. [MT]  2106 I spoke to Dr Fredricka Bonine from trauma surgery reports that, with no acute rib fractures, no clear mechanism for trauma, this is less likely a traumatic pneumothorax.  She would advise hospitalist admission given the patient's need for seizure monitoring, and consideration of IR consult for chest  tube. [MT]  2107 I spoke to the patient and the patient's wife using Spanish translator.  Explained the possibility and need for a chest tube.  However, at this point, given that he is clinically stable, I think the safest plan would be for IR-guided tube placement, as a blind approach by myself at the bedside is more likely to lead to incorrect placement. [MT]  2121 I spoke to Dr [facility] hospitalist who accepted patient for admission for progressive bed at [facility]. [MT]    Clinical Course User Index [MT] Dontrell Stuck, Kermit Balo, MD    Final Clinical Impression(s) / ED Diagnoses Final diagnoses:  Pneumothorax    Rx / DC Orders ED Discharge Orders    None       Renaye Rakers Kermit Balo, MD 01/29/21 [phone]
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
## 2021-01-28 NOTE — H&P (Signed)
History and Physical    Omar Robertson LTJ:[id number] DOB: December 09, 1980 DOA: 01/28/2021  PCP: Storm Frisk, MD (Scheduled to establish on 02/28/2021)  Patient coming from: Home  I have personally briefly reviewed patient's old medical records in [facility]  Chief Complaint: Seizures  HPI: Omar Robertson is a 40 y.o. male with medical history significant of brain surgery, seizures for many years.  Pt recently moved to area from [place] several months ago, dont yet have PCP or neurologist in area.  Pt has been having seizures for many years despite treatment with Keppra and Vimpat.  While on meds he usually has 2-3 seizures a month.  However after running out of his Vimpat 3 days ago, he has been having a seizure about every 5 hours for the past 3 days.  These are generalized tonic-clonic seizures with postictal state.  Typically lasts 3-4 mins.  Wife also reports pt behaving erratically, pt does have h/o mental health problems but typically this is stabilized when he is on vimpat (until he ran out of it 3 days ago).  No SI/HI, self harm.  No headache.  Does have L sided CP that onset just today with some associated SOB.   ED Course: Has 20% L sided PTX, stable on repeat CXR in ED a couple hours later.  Pt transferred from [facility] to [facility] for admission.  Given dose of PO vimpat in ED.   Review of Systems: As per HPI, otherwise all review of systems negative.  Past Medical History:  Diagnosis Date  . Depression   . Epilepsy (HCC)   . Seizure (HCC)   . Seizures (HCC)     Past Surgical History:  Procedure Laterality Date  . BRAIN SURGERY       reports that he has never smoked. He has never used smokeless tobacco. He reports previous alcohol use. He reports previous drug use.  No Known Allergies  Family History  Problem Relation Age of Onset  . Seizures Neg Hx      Prior to Admission medications   Medication Sig Start Date End Date Taking? Authorizing  Provider  escitalopram (LEXAPRO) 20 MG tablet Take 1 tablet (20 mg total) by mouth daily. 01/21/21   Mayers, Cari S, PA-C  Lacosamide (VIMPAT) 100 MG TABS Take 1 tablet (100 mg total) by mouth in the morning and at bedtime. 01/21/21 02/20/21  Mayers, Kasandra Knudsen, PA-C  lacosamide (VIMPAT) 50 MG TABS tablet Take 1 tab PO qAM along with 100 mg tab once daily 01/21/21   Mayers, Cari S, PA-C  levETIRAcetam (KEPPRA) 1000 MG tablet Take 1 tablet (1,000 mg total) by mouth 2 (two) times daily. 01/21/21   Mayers, Kasandra Knudsen, PA-C    Physical Exam: Vitals:   01/28/21 2130 01/28/21 2200 01/28/21 2226 01/28/21 2300  BP: (!) 127/91 (!) 126/95 (!) 143/95 (!) 142/92  Pulse: 91 88 90 89  Resp: (!) 23 (!) 26 18 20   Temp:      TempSrc:      SpO2: 100% 100% 100% 100%  Weight:      Height:        Constitutional: NAD, calm, comfortable Eyes: PERRL, lids and conjunctivae normal ENMT: Mucous membranes are moist. Posterior pharynx clear of any exudate or lesions.Normal dentition.  Neck: normal, supple, no masses, no thyromegaly Respiratory: clear to auscultation bilaterally, no wheezing, no crackles. Normal respiratory effort. No accessory muscle use.  Cardiovascular: Regular rate and rhythm, no murmurs / rubs / gallops. No  extremity edema. 2+ pedal pulses. No carotid bruits.  Abdomen: no tenderness, no masses palpated. No hepatosplenomegaly. Bowel sounds positive.  Musculoskeletal: no clubbing / cyanosis. No joint deformity upper and lower extremities. Good ROM, no contractures. Normal muscle tone.  Skin: no rashes, lesions, ulcers. No induration Neurologic: CN 2-12 grossly intact. Sensation intact, DTR normal. Strength 5/5 in all 4.  Psychiatric: Normal judgment and insight. Alert and oriented x 3. Normal mood.    Labs on Admission: I have personally reviewed following labs and imaging studies  CBC: Recent Labs  Lab 01/28/21 1820  WBC 16.7*  NEUTROABS 12.2*  HGB 15.4  HCT 47.8  MCV 86.6  PLT 319   Basic  Metabolic Panel: Recent Labs  Lab 01/28/21 1820  NA 138  K 4.0  CL 107  CO2 20*  GLUCOSE 119*  BUN 20  CREATININE 1.55*  CALCIUM 9.7  MG 2.5*   GFR: Estimated Creatinine Clearance: 64.4 mL/min (A) (by C-G formula based on SCr of 1.55 mg/dL (H)). Liver Function Tests: Recent Labs  Lab 01/28/21 1820  AST 21  ALT 18  ALKPHOS 59  BILITOT 0.5  PROT 9.4*  ALBUMIN 5.2*   No results for input(s): LIPASE, AMYLASE in the last 168 hours. No results for input(s): AMMONIA in the last 168 hours. Coagulation Profile: No results for input(s): INR, PROTIME in the last 168 hours. Cardiac Enzymes: No results for input(s): CKTOTAL, CKMB, CKMBINDEX, TROPONINI in the last 168 hours. BNP (last 3 results) No results for input(s): PROBNP in the last 8760 hours. HbA1C: No results for input(s): HGBA1C in the last 72 hours. CBG: Recent Labs  Lab 01/28/21 1846  GLUCAP 111*   Lipid Profile: No results for input(s): CHOL, HDL, LDLCALC, TRIG, CHOLHDL, LDLDIRECT in the last 72 hours. Thyroid Function Tests: No results for input(s): TSH, T4TOTAL, FREET4, T3FREE, THYROIDAB in the last 72 hours. Anemia Panel: No results for input(s): VITAMINB12, FOLATE, FERRITIN, TIBC, IRON, RETICCTPCT in the last 72 hours. Urine analysis: No results found for: COLORURINE, APPEARANCEUR, LABSPEC, PHURINE, GLUCOSEU, HGBUR, BILIRUBINUR, KETONESUR, PROTEINUR, UROBILINOGEN, NITRITE, LEUKOCYTESUR  Radiological Exams on Admission: DG Chest Portable 1 View  Result Date: 01/28/2021 CLINICAL DATA:  Repeat radiograph, assess left-sided pneumothorax EXAM: PORTABLE CHEST 1 VIEW COMPARISON:  01/28/2021, 6:38 p.m. FINDINGS: The heart size and mediastinal contours are within normal limits. Unchanged small to moderate left pneumothorax, approximately 20% volume. The right lung is normally aerated. The visualized skeletal structures are unremarkable. IMPRESSION: Unchanged small to moderate left pneumothorax, approximately 20%  volume. The right lung is normally aerated. Electronically Signed   By: Lauralyn Primes M.D.   On: 01/28/2021 20:41   DG Chest Port 1 View  Result Date: 01/28/2021 CLINICAL DATA:  Recent seizure activity EXAM: PORTABLE CHEST 1 VIEW COMPARISON:  07/11/2020 FINDINGS: Cardiac shadow is within normal limits. Right lung is well aerated. Left lung demonstrates a moderate-sized pneumothorax with approximately 4.8 cm of excursion from the apex. No focal infiltrate is seen. Mild atelectasis is noted in the left base. No bony abnormality is noted. IMPRESSION: New left pneumothorax as described. Critical Value/emergent results were called by telephone at the time of interpretation on 01/28/2021 at 7:25 pm to Dr. Alvester Chou , who verbally acknowledged these results. Electronically Signed   By: Alcide Clever M.D.   On: 01/28/2021 19:25    EKG: Independently reviewed.  Assessment/Plan Principal Problem:   Recurrent seizures (HCC) Active Problems:   Epilepsy (HCC)   Traumatic brain injury with loss of consciousness (  HCC)   Pneumothorax    1. Recurrent seizures - 1. Due to running out of Vimpat 2. Got 100mg  PO vimpat in ED.  Looks like he takes 100mg  PO BID normally and another 50mg  in AM (250mg  total daily dose). 3. Give another 100mg  IV load right now. 4. Then resume home dose of 150mg  PO in AM and 100mg  PO in PM 5. Give 1g IV keppra load now 6. Then resume home dose of 1g BID keppra 7. Has outpt referral to neurology in the works 8. Has outpt establish care with PCP scheduled for June 30th. 2. PTX - 1. Spoke with Dr. : 1. If sating okay can just repeat CXR in AM 2. Ordered Repeat CXR in AM 3. Cont pulse ox 4. Tele monitor  DVT prophylaxis: Lovenox Code Status: Full Family Communication: No family in room Disposition Plan: Home after PTX stable and no more seizures Consults called: Spoke with Dr. Admission status: Admit to inpatient  Severity of Illness: The appropriate  patient status for this patient is INPATIENT. Inpatient status is judged to be reasonable and necessary in order to provide the required intensity of service to ensure the patient's safety. The patient's presenting symptoms, physical exam findings, and initial radiographic and laboratory data in the context of their chronic comorbidities is felt to place them at high risk for further clinical deterioration. Furthermore, it is not anticipated that the patient will be medically stable for discharge from the hospital within 2 midnights of admission. The following factors support the patient status of inpatient.   IP status due to recurrent seizures.   * I certify that at the point of admission it is my clinical judgment that the patient will require inpatient hospital care spanning beyond 2 midnights from the point of admission due to high intensity of service, high risk for further deterioration and high frequency of surveillance required.*    Ghali Morissette M. DO [facility]  How to contact the [facility] Attending or Consulting provider 7A - 7P or covering provider during after hours 7P -7A, for this patient?  1. Check the care team in [facility] and look for a) attending/consulting [facility] provider listed and b) the [facility] team listed 2. Log into [email or web address]  Amion Physician Scheduling and messaging for groups and whole hospitals  On call and physician scheduling software for group practices, residents, hospitalists and other medical providers for call, clinic, rotation and shift schedules. OnCall Enterprise is a hospital-wide system for scheduling doctors and paging doctors on call. EasyPlot is for scientific plotting and data analysis.  [email or web address]  and use [facility]'s universal password to access. If you do not have the password, please contact the hospital operator.  3. Locate the [facility] provider you are looking for under [facility] and page to a number that you can be directly reached. 4. If  you still have difficulty reaching the provider, please page the [facility] (Director on Call) for the Hospitalists listed on amion for assistance.  01/28/2021, 11:59 PM

## 2021-01-28 NOTE — ED Notes (Signed)
Md provider at bedside, using interpreter.  Pt corporative wife at bedside, answering majority of questions.  Pt has been out of vimpat for several days

## 2021-01-28 NOTE — ED Notes (Signed)
MSE not signed by patient, AMS

## 2021-01-28 NOTE — Progress Notes (Signed)
Placed patient on 4 liter nasal cannula with humidity per MD order.  RT will continue to monitor.

## 2021-01-28 NOTE — ED Notes (Signed)
[facility] at facility for transport 

## 2021-01-28 NOTE — ED Notes (Signed)
Pharmacy and courier called for vimpat

## 2021-01-28 NOTE — ED Notes (Signed)
Attempted to call report x 1  

## 2021-01-28 NOTE — ED Notes (Signed)
Labs collected in triage, lab notified

## 2021-01-28 NOTE — ED Notes (Signed)
Seizure pads in place

## 2021-01-29 ENCOUNTER — Inpatient Hospital Stay (HOSPITAL_COMMUNITY): Payer: Self-pay

## 2021-01-29 ENCOUNTER — Encounter (HOSPITAL_COMMUNITY): Payer: Self-pay | Admitting: Family Medicine

## 2021-01-29 DIAGNOSIS — J9311 Primary spontaneous pneumothorax: Secondary | ICD-10-CM

## 2021-01-29 DIAGNOSIS — J939 Pneumothorax, unspecified: Secondary | ICD-10-CM

## 2021-01-29 LAB — BASIC METABOLIC PANEL
Anion gap: 9 (ref 5–15)
BUN: 18 mg/dL (ref 6–20)
CO2: 24 mmol/L (ref 22–32)
Calcium: 9.3 mg/dL (ref 8.9–10.3)
Chloride: 107 mmol/L (ref 98–111)
Creatinine, Ser: 1.07 mg/dL (ref 0.61–1.24)
GFR, Estimated: >60 mL/min (ref >60)
Glucose, Bld: 109 mg/dL — ABNORMAL HIGH (ref 70–99)
Potassium: 4.6 mmol/L (ref 3.5–5.1)
Sodium: 140 mmol/L (ref 135–145)

## 2021-01-29 MED ORDER — SODIUM CHLORIDE 0.9% FLUSH
10.0000 mL | Freq: Three times a day (TID) | INTRAVENOUS | Status: DC
  Administered 2021-01-30 – 2021-01-31 (×4): 10 mL

## 2021-01-29 MED ORDER — HYDROCODONE-ACETAMINOPHEN 5-325 MG PO TABS
1.0000 | ORAL_TABLET | ORAL | Status: DC | PRN
Start: 2021-01-29 — End: 2021-01-31
  Administered 2021-01-29 – 2021-01-31 (×3): 1 via ORAL
  Filled 2021-01-29 (×3): qty 1

## 2021-01-29 NOTE — Progress Notes (Signed)
Informed consent completed by MD at bedside with spanish interpretor Aracely # [phone]. Patient verbalized via interpreter services understanding of procedure and verbalized no questions or concerns.   Assisted MD and residents with cycling/monitoring vital signs and as needed at bedside with chest tube insertion procedure.

## 2021-01-29 NOTE — Progress Notes (Signed)
Called to discuss patient with Dr. Jarvis Newcomer, who has taken over for this patient. No reports of preceding traumatic event. Discussed the management of spontaneous PTX to include an assessment of prior history of PTX (especially spontaneous), baseline lung disease, and smoking history +/- noncontrasted CT chest to evaluate for bleb disease. Notified that pulmonology was also consulted. In the absence of a traumatic event, will defer management to Pulmonology and primary team. Our team is happy to discuss further or re-consult should the need arise.   Diamantina Monks, MD General and Trauma Surgery [facility]

## 2021-01-29 NOTE — Telephone Encounter (Signed)
Thank you for this clarity. We will share with patient.

## 2021-01-29 NOTE — Progress Notes (Signed)
Physician paged. Patient complaining of 8/10 pain in area of chest tube insertion site. No SOB experienced, vital signs stable, patient is alert and oriented. Will proceed with pain administration as directed.

## 2021-01-29 NOTE — Progress Notes (Signed)
PROGRESS NOTE  Omar Robertson  WNU:[id number] DOB: [birth year] DOA: 01/28/2021 PCP: No primary care provider on file.   Brief Narrative: Omar Robertson is a 40 y.o. male with a history of brain cyst s/p resection and long history of seizure disorder who presented to the ED 5/30 having seizures every 5 hours or so after running out of seizure medications 3 days prior. Work up also revealed moderate left-sided pneumothorax, though the patient did not report traumatic history or history of pneumothorax, though the patient had no respiratory complaints or hypoxia. The patient was admitted after consultation with trauma surgery. He's had no seizures since receiving AEDs and PTX is roughly stable on CXR the following morning. Pulmonology recommended 100% FiO2 and repeat radiograph in hopes of resorption to avoiding thoracostomy tube.  Assessment & Plan: Principal Problem:   Recurrent seizures (HCC) Active Problems:   Epilepsy (HCC)   Traumatic brain injury with loss of consciousness (HCC)   Pneumothorax  Seizure disorder with seizures in setting of running out of AEDs:  - Improved now that we've reinstated AEDs. Will therefore, not consult neurology at this time.  - Will need neurology follow up which is pending. Also has appointment to establish care with PCP.   Pneumothorax: Seems to be first episode of spontaneous PTX that is stable on repeat this AM.  - It's unclear whether chest tube would be required at this time. Given his asymptomatic nature, it's reasonable to provide 100% FiO2 and monitor for improvement as a first step. D/w Dr. Marchelle Gearing (Pulm) and Dr. Bedelia Person (Surgery) to inform current plan.   DVT prophylaxis: Lovenox Code Status: Full Family Communication: None at bedside Disposition Plan:  Status is: Inpatient  Remains inpatient appropriate because:Inpatient level of care appropriate due to severity of illness  Dispo: The patient is from: Home              Anticipated d/c is  to: Home              Patient currently is not medically stable to d/c.   Difficult to place patient No  Consultants:   Discussed with trauma surgery who didn't feel formal consultation was required.  Discussed with pulmonology who recommended the above plan of care in lieu of formal consult at this time.  Procedures:   None  Antimicrobials:  None   Subjective: Spanish video interpretor Ignacia Bayley [phone] used throughout encounter. Pt confirms above history. He is vague about the circumstances prior to admission though admits he can't recall a significant amount of time. He's not aware of falling/trauma. Hasn't had air around his lungs previously.   Objective: Vitals:   01/28/21 2300 01/29/21 0000 01/29/21 0310 01/29/21 0800  BP: (!) 142/92 132/83 116/68 (!) 143/86  Pulse: 89 88 80 76  Resp: 20 18 18 18   Temp: 98.4 F (36.9 C) 98.4 F (36.9 C) 98.3 F (36.8 C) 98.2 F (36.8 C)  TempSrc: Oral Oral Axillary Oral  SpO2: 100% 100% 100% 98%  Weight:      Height:       No intake or output data in the 24 hours ending 01/29/21 1337 Filed Weights   01/28/21 1815  Weight: 72.6 kg    Gen: 40 y.o. male in no distress Pulm: Non-labored breathing room air, diminished on left apex, otherwise clear.  CV: Regular rate and rhythm. No murmur, rub, or gallop. No JVD, no pedal edema. GI: Abdomen soft, non-tender, non-distended, with normoactive bowel sounds. No organomegaly or masses felt.  Ext: Warm, no deformities Skin: No rashes, lesions or ulcers Neuro: Alert and oriented. No focal neurological deficits. Psych: Judgement and insight appear normal. Mood & affect appropriate.   Data Reviewed: I have personally reviewed following labs and imaging studies  CBC: Recent Labs  Lab 01/28/21 1820  WBC 16.7*  NEUTROABS 12.2*  HGB 15.4  HCT 47.8  MCV 86.6  PLT 319   Basic Metabolic Panel: Recent Labs  Lab 01/28/21 1820 01/29/21 0155  NA 138 140  K 4.0 4.6  CL 107 107  CO2 20*  24  GLUCOSE 119* 109*  BUN 20 18  CREATININE 1.55* 1.07  CALCIUM 9.7 9.3  MG 2.5*  --    GFR: Estimated Creatinine Clearance: 93.3 mL/min (by C-G formula based on SCr of 1.07 mg/dL). Liver Function Tests: Recent Labs  Lab 01/28/21 1820  AST 21  ALT 18  ALKPHOS 59  BILITOT 0.5  PROT 9.4*  ALBUMIN 5.2*   No results for input(s): LIPASE, AMYLASE in the last 168 hours. No results for input(s): AMMONIA in the last 168 hours. Coagulation Profile: No results for input(s): INR, PROTIME in the last 168 hours. Cardiac Enzymes: No results for input(s): CKTOTAL, CKMB, CKMBINDEX, TROPONINI in the last 168 hours. BNP (last 3 results) No results for input(s): PROBNP in the last 8760 hours. HbA1C: No results for input(s): HGBA1C in the last 72 hours. CBG: Recent Labs  Lab 01/28/21 1846  GLUCAP 111*   Lipid Profile: No results for input(s): CHOL, HDL, LDLCALC, TRIG, CHOLHDL, LDLDIRECT in the last 72 hours. Thyroid Function Tests: No results for input(s): TSH, T4TOTAL, FREET4, T3FREE, THYROIDAB in the last 72 hours. Anemia Panel: No results for input(s): VITAMINB12, FOLATE, FERRITIN, TIBC, IRON, RETICCTPCT in the last 72 hours. Urine analysis: No results found for: COLORURINE, APPEARANCEUR, LABSPEC, PHURINE, GLUCOSEU, HGBUR, BILIRUBINUR, KETONESUR, PROTEINUR, UROBILINOGEN, NITRITE, LEUKOCYTESUR Recent Results (from the past 240 hour(s))  Resp Panel by RT-PCR (Flu A&B, Covid) Nasopharyngeal Swab     Status: None   Collection Time: 01/28/21  8:20 PM   Specimen: Nasopharyngeal Swab; Nasopharyngeal(NP) swabs in vial transport medium  Result Value Ref Range Status   SARS Coronavirus 2 by RT PCR NEGATIVE NEGATIVE Final    Comment: (NOTE) SARS-CoV-2 target nucleic acids are NOT DETECTED.  The SARS-CoV-2 RNA is generally detectable in upper respiratory specimens during the acute phase of infection. The lowest concentration of SARS-CoV-2 viral copies this assay can detect is 138  copies/mL. A negative result does not preclude SARS-Cov-2 infection and should not be used as the sole basis for treatment or other patient management decisions. A negative result may occur with  improper specimen collection/handling, submission of specimen other than nasopharyngeal swab, presence of viral mutation(s) within the areas targeted by this assay, and inadequate number of viral copies(<138 copies/mL). A negative result must be combined with clinical observations, patient history, and epidemiological information. The expected result is Negative.  Fact Sheet for Patients:  BloggerCourse.com  Fact Sheet for Healthcare Providers:  SeriousBroker.it  This test is no t yet approved or cleared by the [place] FDA and  has been authorized for detection and/or diagnosis of SARS-CoV-2 by FDA under an Emergency Use Authorization (EUA). This EUA will remain  in effect (meaning this test can be used) for the duration of the COVID-19 declaration under Section 564(b)(1) of the Act, 21 U.S.C.section 360bbb-3(b)(1), unless the authorization is terminated  or revoked sooner.       Influenza A by PCR NEGATIVE NEGATIVE  Final   Influenza B by PCR NEGATIVE NEGATIVE Final    Comment: (NOTE) The Xpert Xpress SARS-CoV-2/FLU/RSV plus assay is intended as an aid in the diagnosis of influenza from Nasopharyngeal swab specimens and should not be used as a sole basis for treatment. Nasal washings and aspirates are unacceptable for Xpert Xpress SARS-CoV-2/FLU/RSV testing.  Fact Sheet for Patients: BloggerCourse.com  Fact Sheet for Healthcare Providers: SeriousBroker.it  This test is not yet approved or cleared by the [place] FDA and has been authorized for detection and/or diagnosis of SARS-CoV-2 by FDA under an Emergency Use Authorization (EUA). This EUA will remain in effect (meaning  this test can be used) for the duration of the COVID-19 declaration under Section 564(b)(1) of the Act, 21 U.S.C. section 360bbb-3(b)(1), unless the authorization is terminated or revoked.  Performed at [facility], [address] [place], [place] [address]       Radiology Studies: DG CHEST PORT 1 VIEW  Result Date: 01/29/2021 CLINICAL DATA:  Pneumothorax. EXAM: PORTABLE CHEST 1 VIEW COMPARISON:  01/28/2021. FINDINGS: Mediastinum hilar structures normal. Stable cardiomegaly. Low lung volumes with bibasilar atelectasis. No pleural effusion. Moderate left-sided pneumothorax is again noted without interim change. IMPRESSION: 1. Moderate left-sided pneumothorax again noted without interim change. 2.  Low lung volumes with bibasilar atelectasis. 3.  Stable cardiomegaly. Electronically Signed   By: Maisie Fus  Register   On: 01/29/2021 07:15   DG Chest Portable 1 View  Result Date: 01/28/2021 CLINICAL DATA:  Repeat radiograph, assess left-sided pneumothorax EXAM: PORTABLE CHEST 1 VIEW COMPARISON:  01/28/2021, 6:38 p.m. FINDINGS: The heart size and mediastinal contours are within normal limits. Unchanged small to moderate left pneumothorax, approximately 20% volume. The right lung is normally aerated. The visualized skeletal structures are unremarkable. IMPRESSION: Unchanged small to moderate left pneumothorax, approximately 20% volume. The right lung is normally aerated. Electronically Signed   By: Lauralyn Primes M.D.   On: 01/28/2021 20:41   DG Chest Port 1 View  Result Date: 01/28/2021 CLINICAL DATA:  Recent seizure activity EXAM: PORTABLE CHEST 1 VIEW COMPARISON:  07/11/2020 FINDINGS: Cardiac shadow is within normal limits. Right lung is well aerated. Left lung demonstrates a moderate-sized pneumothorax with approximately 4.8 cm of excursion from the apex. No focal infiltrate is seen. Mild atelectasis is noted in the left base. No bony abnormality is noted. IMPRESSION: New left pneumothorax  as described. Critical Value/emergent results were called by telephone at the time of interpretation on 01/28/2021 at 7:25 pm to Dr. Alvester Chou , who verbally acknowledged these results. Electronically Signed   By: Alcide Clever M.D.   On: 01/28/2021 19:25    Scheduled Meds: . enoxaparin (LOVENOX) injection  40 mg Subcutaneous Q24H  . escitalopram  20 mg Oral Daily  . lacosamide  100 mg Oral QHS  . lacosamide  150 mg Oral Daily  . levETIRAcetam  1,000 mg Oral BID   Continuous Infusions:   LOS: 1 day   Time spent: 25 minutes.  Tyrone Nine, MD [facility] [email or web address] 01/29/2021, 1:37 PM

## 2021-01-29 NOTE — Plan of Care (Signed)

## 2021-01-29 NOTE — Progress Notes (Signed)
MD Jarvis Newcomer epic chatted and confirmed in person with this RN for verbal order to place pt on 15LNRB for management of Pneumo. Placed on NRB at     1022. Translator used to educate patient about treatment, ability to eat and drink with mask off for short periods of time. Will get CXR later this afternoon/evening.   Akiba Melfi RN

## 2021-01-29 NOTE — Consult Note (Signed)
NAME:  Omar Robertson, MRN:  [id number], DOB:  1981-01-12, LOS: 1 ADMISSION DATE:  01/28/2021, CONSULTATION DATE:  01/29/21 REFERRING MD:  Hazeline Junker MD, CHIEF COMPLAINT:  LEft pneumothorax   History of Present Illness:   40 year old Spanish only speaking patient.  History obtained from review of the chart, talking with the hospitalist and also through the official interpreter on AMN services - ARacely [phone].  He has known seizure disorder with a previous history of brain surgery.  He has relocated from [place] to [place].  Does not have a local neurologist or PCP.  He is on Keppra and Vimpat.  At baseline he has 2 or 3 seizures a month.  He ran out of his Vimpat 3 days ago and started having seizures for about every 5 hours for the past 3 days.  This was associated with erratic behavior.  On the day of admission 01/28/2021 he had left-sided chest pain.  Chest x-ray showed 20% left-sided pneumothorax.  This morning pneumothorax persisted and hospitalist increase his oxygen from 2 L nasal cannula to facemask nonrebreather.  By afternoon of 01/29/2021 the pneumothorax persisted and radiologist reported as moderate persistent pneumothorax [personally visualized and agree with the findings].. His MEWS score is 0 and he is stable  Pertinent  Medical History     has a past medical history of Depression, Epilepsy (HCC), Seizure (HCC), and Seizures (HCC).   reports that he has never smoked. He has never used smokeless tobacco.  Past Surgical History:  Procedure Laterality Date  . BRAIN SURGERY      No Known Allergies   There is no immunization history on file for this patient.  Family History  Problem Relation Age of Onset  . Seizures Neg Hx      Current Facility-Administered Medications:  .  acetaminophen (TYLENOL) tablet 650 mg, 650 mg, Oral, Q6H PRN, 650 mg at 01/29/21 0129 **OR** acetaminophen (TYLENOL) suppository 650 mg, 650 mg, Rectal, Q6H PRN, Hillary Bow, DO .   enoxaparin (LOVENOX) injection 40 mg, 40 mg, Subcutaneous, Q24H, Gardner, Jared M, DO, 40 mg at 01/29/21 0905 .  escitalopram (LEXAPRO) tablet 20 mg, 20 mg, Oral, Daily, Lyda Perone M, DO, 20 mg at 01/29/21 0906 .  lacosamide (VIMPAT) tablet 100 mg, 100 mg, Oral, QHS, Gardner, Jared M, DO .  lacosamide (VIMPAT) tablet 150 mg, 150 mg, Oral, Daily, Lyda Perone M, DO, 150 mg at 01/29/21 0905 .  levETIRAcetam (KEPPRA) tablet 1,000 mg, 1,000 mg, Oral, BID, Hillary Bow, DO, 1,000 mg at 01/29/21 4401 .  LORazepam (ATIVAN) injection 2 mg, 2 mg, Intravenous, Q6H PRN, Julian Reil, Jared M, DO .  ondansetron (ZOFRAN) tablet 4 mg, 4 mg, Oral, Q6H PRN **OR** ondansetron (ZOFRAN) injection 4 mg, 4 mg, Intravenous, Q6H PRN, Hillary Bow, DO   Significant Hospital Events: Including procedures, antibiotic start and stop dates in addition to other pertinent events   .   Interim History / Subjective:  5/31 0 seen in bedside  Objective   Blood pressure (!) 143/86, pulse 76, temperature 98.2 F (36.8 C), temperature source Oral, resp. rate 18, height 5\' 11"  (1.803 m), weight 72.6 kg, SpO2 98 %.       No intake or output data in the 24 hours ending 01/29/21 1622 Filed Weights   01/28/21 1815  Weight: 72.6 kg    Examination: General: looks well.01/30/21 FAce mask o2 on HENT: Face mask o2 on. No elevated JVP. No neck nodes Lungs: CTA bilaterally but  air entry diminished left side somewhat Cardiovascular: RRR. No murmurs Abdomen: soft Extremities: intact Neuro: alert and oriented x3. Cognitively intact GU: not examined   Labs/imaging that I havepersonally reviewed  (right click and "Reselect all SmartList Selections" daily)    LABS    PULMONARY No results for input(s): PHART, PCO2ART, PO2ART, HCO3, TCO2, O2SAT in the last 168 hours.  Invalid input(s): PCO2, PO2  CBC Recent Labs  Lab 01/28/21 1820  HGB 15.4  HCT 47.8  WBC 16.7*  PLT 319    COAGULATION No results for input(s):  INR in the last 168 hours.  CARDIAC  No results for input(s): TROPONINI in the last 168 hours. No results for input(s): PROBNP in the last 168 hours.   CHEMISTRY Recent Labs  Lab 01/28/21 1820 01/29/21 0155  NA 138 140  K 4.0 4.6  CL 107 107  CO2 20* 24  GLUCOSE 119* 109*  BUN 20 18  CREATININE 1.55* 1.07  CALCIUM 9.7 9.3  MG 2.5*  --    Estimated Creatinine Clearance: 93.3 mL/min (by C-G formula based on SCr of 1.07 mg/dL).   LIVER Recent Labs  Lab 01/28/21 1820  AST 21  ALT 18  ALKPHOS 59  BILITOT 0.5  PROT 9.4*  ALBUMIN 5.2*     INFECTIOUS No results for input(s): LATICACIDVEN, PROCALCITON in the last 168 hours.   ENDOCRINE CBG (last 3)  Recent Labs    01/28/21 1846  GLUCAP 111*         IMAGING x48h  - image(s) personally visualized  -   highlighted in bold DG CHEST PORT 1 VIEW  Result Date: 01/29/2021 CLINICAL DATA:  Pneumothorax EXAM: PORTABLE CHEST - 1 VIEW COMPARISON:  Earlier film of the same day FINDINGS: There is still a moderate left pneumothorax. The apex of the lung projects at the level of the left fifth rib posterior aspect as before. There is persistent left retrocardiac consolidation/atelectasis with air bronchograms. No definite effusion. The right lung remains clear. Heart size and mediastinal contours are within normal limits. No mediastinal shift. Visualized bones unremarkable. IMPRESSION: Stable moderate left pneumothorax. Electronically Signed   By: Corlis Leak M.D.   On: 01/29/2021 15:25   DG CHEST PORT 1 VIEW  Result Date: 01/29/2021 CLINICAL DATA:  Pneumothorax. EXAM: PORTABLE CHEST 1 VIEW COMPARISON:  01/28/2021. FINDINGS: Mediastinum hilar structures normal. Stable cardiomegaly. Low lung volumes with bibasilar atelectasis. No pleural effusion. Moderate left-sided pneumothorax is again noted without interim change. IMPRESSION: 1. Moderate left-sided pneumothorax again noted without interim change. 2.  Low lung volumes with  bibasilar atelectasis. 3.  Stable cardiomegaly. Electronically Signed   By: Maisie Fus  Register   On: 01/29/2021 07:15   DG Chest Portable 1 View  Result Date: 01/28/2021 CLINICAL DATA:  Repeat radiograph, assess left-sided pneumothorax EXAM: PORTABLE CHEST 1 VIEW COMPARISON:  01/28/2021, 6:38 p.m. FINDINGS: The heart size and mediastinal contours are within normal limits. Unchanged small to moderate left pneumothorax, approximately 20% volume. The right lung is normally aerated. The visualized skeletal structures are unremarkable. IMPRESSION: Unchanged small to moderate left pneumothorax, approximately 20% volume. The right lung is normally aerated. Electronically Signed   By: Lauralyn Primes M.D.   On: 01/28/2021 20:41   DG Chest Port 1 View  Result Date: 01/28/2021 CLINICAL DATA:  Recent seizure activity EXAM: PORTABLE CHEST 1 VIEW COMPARISON:  07/11/2020 FINDINGS: Cardiac shadow is within normal limits. Right lung is well aerated. Left lung demonstrates a moderate-sized pneumothorax with approximately 4.8 cm of  excursion from the apex. No focal infiltrate is seen. Mild atelectasis is noted in the left base. No bony abnormality is noted. IMPRESSION: New left pneumothorax as described. Critical Value/emergent results were called by telephone at the time of interpretation on 01/28/2021 at 7:25 pm to Dr. Alvester Chou , who verbally acknowledged these results. Electronically Signed   By: Alcide Clever M.D.   On: 01/28/2021 19:25     Resolved Hospital Problem list   x  Assessment & Plan:  LEft spontaneous pneumothorax (technically spontaneous but most likely traumatic from seizure and fall).  No evidence of rib fracture  Persistent without improvement x24 hours.  Moderate in size/severity.  Patient tolerating it physiologically well.  Plan - Left-sided chest tube using pigtail/Wayne catheter and then once inserted placed to suction for the next 1-3 days  -Informed consent was obtained from the patient  using formal translator services.  His brother was also informed after he gave consent.  The bedside nurse was the witness of the consent process.  Risks of bleeding, vasovagal syncope and hemodynamic instability and failure to resolve pneumothorax all explained.  Currently benefit outweighs risk.  This also was explained.  Patient has verbalized understanding and agreement to proceed.  Updated Triad MD Dr Dione Plover practice (right click and "Reselect all SmartList Selections" daily)  Per [facility] MD [phone] Dr Dorna Leitz    Dr. Kalman Shan, M.D., F.C.C.P,  Pulmonary and Critical Care Medicine Staff Physician, [facility] Director - Interstitial Lung Disease  Program  Pulmonary Fibrosis [facility] Network at [facility] [place], [place], [address]  Pager: [phone], If no answer  OR between  19:00-7:00h: page [phone] Telephone (clinical office): [phone] Telephone (research): [phone]  4:23 PM 01/29/2021

## 2021-01-29 NOTE — Progress Notes (Signed)
eLink Physician-Brief Progress Note Patient Name: Omar Robertson DOB: [birth year] MRN: [id number]   Date of Service  01/29/2021  HPI/Events of Note  Patient c/o pain at chest tube site.   eICU Interventions  Plan: 1. Norco 5 mg / 325 mg PO Q 4 hours PRN moderate or severe pain.      Intervention Category Major Interventions: Other:  Lenell Antu 01/29/2021, 7:41 PM

## 2021-01-29 NOTE — Progress Notes (Signed)
   pst chest tube cxr visualized - look expanded  Formal report pending    SIGNATURE    Dr. Kalman Shan, M.D., F.C.C.P,  Pulmonary and Critical Care Medicine Staff Physician, [facility] Center Director - Interstitial Lung Disease  Program  Pulmonary Fibrosis [facility] Network at [facility] [place], [place], [address]  Pager: [phone], If no answer  OR between  19:00-7:00h: page [phone] Telephone (clinical office): [phone] Telephone (research): [phone]  6:38 PM 01/29/2021

## 2021-01-29 NOTE — Procedures (Addendum)
Insertion of Chest Tube Procedure Note  Omar Robertson  [id number]  [birth year]  Date:01/29/21  Time:5:53 PM    Provider Performing: Lidia Collum   Procedure: Chest Tube Insertion [phone])  Indication(s) Pneumothorax  Consent Risks of the procedure as well as the alternatives and risks of each were explained to the patient and/or caregiver.  Consent for the procedure was obtained and is signed in the bedside chart  Anesthesia Topical only with 1% lidocaine    Time Out Verified patient identification, verified procedure, site/side was marked, verified correct patient position, special equipment/implants available, medications/allergies/relevant history reviewed, required imaging and test results available.   Sterile Technique Maximal sterile technique including full sterile barrier drape, hand hygiene, sterile gown, sterile gloves, mask, hair covering, sterile ultrasound probe cover (if used).   Procedure Description Ultrasound not used to identify appropriate pleural anatomy for placement and overlying skin marked. Area of placement cleaned and draped in sterile fashion.  A 14 French pigtail pleural catheter was placed into the left pleural space using Seldinger technique. Appropriate return of air was obtained.  The tube was connected to atrium and placed on -20 cm H2O wall suction.   Complications/Tolerance None; patient tolerated the procedure well. Chest X-ray is ordered to verify placement.   EBL Minimal  Specimen(s) none   JD [name] [facility] Pulmonary & Critical Care 01/29/2021, 5:55 PM  Please see [email or web address] for pager details.  From 7A-7P if no response, please call [phone]. After hours, please call ELink [phone].   Xxxxx Staff MD note  I personally supervised the entire procedure at the bedside by the APP. CXR oprdered     SIGNATURE    Dr. Kalman Shan, M.D., F.C.C.P,  Pulmonary and Critical Care Medicine Staff  Physician, [facility] Center Director - Interstitial Lung Disease  Program  Pulmonary Fibrosis [facility] Network at [facility] [place], [place], [address]  Pager: [phone], If no answer  OR between  19:00-7:00h: page [phone] Telephone (clinical office): [phone] Telephone (research): [phone]  5:59 PM 01/29/2021

## 2021-01-30 ENCOUNTER — Inpatient Hospital Stay (HOSPITAL_COMMUNITY): Payer: Self-pay

## 2021-01-30 MED ORDER — KETOTIFEN FUMARATE 0.025 % OP SOLN
1.0000 [drp] | Freq: Two times a day (BID) | OPHTHALMIC | Status: DC
  Administered 2021-01-31: 1 [drp] via OPHTHALMIC
  Filled 2021-01-30: qty 5

## 2021-01-30 NOTE — Progress Notes (Signed)
Patient resting without any distress at this time. Patient is A&O*4, able to verbalize needs to staff. No CO pain or discomfort at this time. Chest tube dry and intact to the left upper chest with minimal bright red output. Dressing to chest tube dry and intact.

## 2021-01-30 NOTE — Consult Note (Signed)
NAME:  Omar Robertson, MRN:  [id number], DOB:  1980/11/17, LOS: 2 ADMISSION DATE:  01/28/2021, CONSULTATION DATE:  01/29/21 REFERRING MD:  Hazeline Junker MD, CHIEF COMPLAINT:  LEft pneumothorax   BRIEF   40 year old Spanish only speaking patient.  History obtained from review of the chart, talking with the hospitalist and also through the official interpreter on AMN services - ARacely [phone].  He has known seizure disorder with a previous history of brain surgery.  He has relocated from [place] to [place].  Does not have a local neurologist or PCP.  He is on Keppra and Vimpat.  At baseline he has 2 or 3 seizures a month.  He ran out of his Vimpat 3 days ago and started having seizures for about every 5 hours for the past 3 days.  This was associated with erratic behavior.  On the day of admission 01/28/2021 he had left-sided chest pain.  Chest x-ray showed 20% left-sided pneumothorax.  This morning pneumothorax persisted and hospitalist increase his oxygen from 2 L nasal cannula to facemask nonrebreather.  By afternoon of 01/29/2021 the pneumothorax persisted and radiologist reported as moderate persistent pneumothorax [personally visualized and agree with the findings].. His MEWS score is 0 and he is stable  Pertinent  Medical History     has a past medical history of Depression, Epilepsy (HCC), Seizure (HCC), and Seizures (HCC).   reports that he has never smoked. He has never used smokeless tobacco.  Past Surgical History:  Procedure Laterality Date  . BRAIN SURGERY      Robertson Known Allergies   There is Robertson immunization history on file for this patient.  Family History  Problem Relation Age of Onset  . Seizures Neg Hx      Current Facility-Administered Medications:  .  acetaminophen (TYLENOL) tablet 650 mg, 650 mg, Oral, Q6H PRN, 650 mg at 01/29/21 0129 **OR** acetaminophen (TYLENOL) suppository 650 mg, 650 mg, Rectal, Q6H PRN, Hillary Bow, DO .  enoxaparin (LOVENOX)  injection 40 mg, 40 mg, Subcutaneous, Q24H, Gardner, Jared M, DO, 40 mg at 01/30/21 7846 .  escitalopram (LEXAPRO) tablet 20 mg, 20 mg, Oral, Daily, Lyda Perone M, DO, 20 mg at 01/30/21 9629 .  HYDROcodone-acetaminophen (NORCO/VICODIN) 5-325 MG per tablet 1 tablet, 1 tablet, Oral, Q4H PRN, Karl Ito, MD, 1 tablet at 01/30/21 0351 .  lacosamide (VIMPAT) tablet 100 mg, 100 mg, Oral, QHS, Gardner, Jared M, DO, 100 mg at 01/29/21 2104 .  lacosamide (VIMPAT) tablet 150 mg, 150 mg, Oral, Daily, Lyda Perone M, DO, 150 mg at 01/30/21 0929 .  levETIRAcetam (KEPPRA) tablet 1,000 mg, 1,000 mg, Oral, BID, Hillary Bow, DO, 1,000 mg at 01/30/21 5284 .  LORazepam (ATIVAN) injection 2 mg, 2 mg, Intravenous, Q6H PRN, Julian Reil, Jared M, DO .  ondansetron (ZOFRAN) tablet 4 mg, 4 mg, Oral, Q6H PRN **OR** ondansetron (ZOFRAN) injection 4 mg, 4 mg, Intravenous, Q6H PRN, Hillary Bow, DO .  sodium chloride flush (NS) 0.9 % injection 10 mL, 10 mL, Intracatheter, Q8H, Yarrow Linhart, MD, 10 mL at 01/30/21 0929   Significant Hospital Events: Including procedures, antibiotic start and stop dates in addition to other pertinent events   . 5/31 - left wayne cath to suction  Interim History / Subjective:    01/30/2021 - had pain post chest tube. Currently denies complaints when asked in simple Spanish. Chest tube to suction  Objective   Blood pressure (!) 130/93, pulse 69, temperature 98.7 F (37.1 C),  temperature source Oral, resp. rate (!) 21, height 5\' 11"  (1.803 m), weight 72.6 kg, SpO2 99 %.       Robertson intake or output data in the 24 hours ending 01/30/21 1020 Filed Weights   01/28/21 1815  Weight: 72.6 kg    Examination: General Appearance:  Looks cwell Head:  Normocephalic, without obvious abnormality, atraumatic Eyes:  PERRL - yes, conjunctiva/corneas - muddy     Ears:  Normal external ear canals, both ears Nose:  G tube - Robertson Throat:  ETT TUBE - Robertson , OG tube - Robertson Neck:  Supple,  Robertson  enlargement/tenderness/nodules Lungs: Clear to auscultation bilaterally, . Left wayne cath to left infraclav mid clav line Heart:  S1 and S2 normal, Robertson murmur, CVP - Robertson.  Pressors - Robertson Abdomen:  Soft, Robertson masses, Robertson organomegaly Genitalia / Rectal:  Not done Extremities:  Extremities- intact Skin:  ntact in exposed areas . Sacral area - not examined Neurologic:  Sedation - none -> RASS - +1 . Moves all 4s - yes. CAM-ICU - neg . Orientation - x3+    Labs/imaging that I havepersonally reviewed  (right click and "Reselect all SmartList Selections" daily)    LABS    PULMONARY Robertson results for input(s): PHART, PCO2ART, PO2ART, HCO3, TCO2, O2SAT in the last 168 hours.  Invalid input(s): PCO2, PO2  CBC Recent Labs  Lab 01/28/21 1820  HGB 15.4  HCT 47.8  WBC 16.7*  PLT 319    COAGULATION Robertson results for input(s): INR in the last 168 hours.  CARDIAC  Robertson results for input(s): TROPONINI in the last 168 hours. Robertson results for input(s): PROBNP in the last 168 hours.   CHEMISTRY Recent Labs  Lab 01/28/21 1820 01/29/21 0155  NA 138 140  K 4.0 4.6  CL 107 107  CO2 20* 24  GLUCOSE 119* 109*  BUN 20 18  CREATININE 1.55* 1.07  CALCIUM 9.7 9.3  MG 2.5*  --    Estimated Creatinine Clearance: 93.3 mL/min (by C-G formula based on SCr of 1.07 mg/dL).   LIVER Recent Labs  Lab 01/28/21 1820  AST 21  ALT 18  ALKPHOS 59  BILITOT 0.5  PROT 9.4*  ALBUMIN 5.2*     INFECTIOUS Robertson results for input(s): LATICACIDVEN, PROCALCITON in the last 168 hours.   ENDOCRINE CBG (last 3)  Recent Labs    01/28/21 1846  GLUCAP 111*         IMAGING x48h  - image(s) personally visualized  -   highlighted in bold DG CHEST PORT 1 VIEW  Result Date: 01/29/2021 CLINICAL DATA:  Chest tube placement.  Altered mental status EXAM: PORTABLE CHEST 1 VIEW COMPARISON:  01/29/2021 FINDINGS: Interval placement of left chest tube. Re-expansion of the left lung. Robertson visible pneumothorax although  the left apex is not imaged on this image. Left base atelectasis noted. Right lung clear. Heart is borderline in size. IMPRESSION: Placement of left chest tube with re-expansion of the left lung. Robertson visible pneumothorax although the left apex is excluded. Electronically Signed   By: 01/31/2021 M.D.   On: 01/29/2021 19:07   DG CHEST PORT 1 VIEW  Result Date: 01/29/2021 CLINICAL DATA:  Pneumothorax EXAM: PORTABLE CHEST - 1 VIEW COMPARISON:  Earlier film of the same day FINDINGS: There is still a moderate left pneumothorax. The apex of the lung projects at the level of the left fifth rib posterior aspect as before. There is persistent left retrocardiac consolidation/atelectasis with air bronchograms.  Robertson definite effusion. The right lung remains clear. Heart size and mediastinal contours are within normal limits. Robertson mediastinal shift. Visualized bones unremarkable. IMPRESSION: Stable moderate left pneumothorax. Electronically Signed   By: Corlis Leak M.D.   On: 01/29/2021 15:25   DG CHEST PORT 1 VIEW  Result Date: 01/29/2021 CLINICAL DATA:  Pneumothorax. EXAM: PORTABLE CHEST 1 VIEW COMPARISON:  01/28/2021. FINDINGS: Mediastinum hilar structures normal. Stable cardiomegaly. Low lung volumes with bibasilar atelectasis. Robertson pleural effusion. Moderate left-sided pneumothorax is again noted without interim change. IMPRESSION: 1. Moderate left-sided pneumothorax again noted without interim change. 2.  Low lung volumes with bibasilar atelectasis. 3.  Stable cardiomegaly. Electronically Signed   By: Maisie Fus  Register   On: 01/29/2021 07:15   DG Chest Portable 1 View  Result Date: 01/28/2021 CLINICAL DATA:  Repeat radiograph, assess left-sided pneumothorax EXAM: PORTABLE CHEST 1 VIEW COMPARISON:  01/28/2021, 6:38 p.m. FINDINGS: The heart size and mediastinal contours are within normal limits. Unchanged small to moderate left pneumothorax, approximately 20% volume. The right lung is normally aerated. The visualized  skeletal structures are unremarkable. IMPRESSION: Unchanged small to moderate left pneumothorax, approximately 20% volume. The right lung is normally aerated. Electronically Signed   By: Lauralyn Primes M.D.   On: 01/28/2021 20:41   DG Chest Port 1 View  Result Date: 01/28/2021 CLINICAL DATA:  Recent seizure activity EXAM: PORTABLE CHEST 1 VIEW COMPARISON:  07/11/2020 FINDINGS: Cardiac shadow is within normal limits. Right lung is well aerated. Left lung demonstrates a moderate-sized pneumothorax with approximately 4.8 cm of excursion from the apex. Robertson focal infiltrate is seen. Mild atelectasis is noted in the left base. Robertson bony abnormality is noted. IMPRESSION: New left pneumothorax as described. Critical Value/emergent results were called by telephone at the time of interpretation on 01/28/2021 at 7:25 pm to Dr. Alvester Chou , who verbally acknowledged these results. Electronically Signed   By: Alcide Clever M.D.   On: 01/28/2021 19:25     Resolved Hospital Problem list   x  Assessment & Plan:  LEft spontaneous pneumothorax (technically spontaneous but most likely traumatic from seizure and fall).  Robertson evidence of rib fracture on cxr. Moderate size as of 01/29/21 /sp wayne cath to suction 01/29/21   01/30/2021 - on suction and still with air bubbles on deep inspiration   Plan - Left-sided chest tube using pigtail/Wayne catheter  -> suction off and get CXR later 01/30/21 at 2pm -> if lungs well expanded keep off suction through 01/31/21     D/w Nechama Guard and bedside RN  Best practice (right click and "Reselect all SmartList Selections" daily)  Per [facility] MD [phone]  SIGNATURE    Dr. Kalman Shan, M.D., F.C.C.P,  Pulmonary and Critical Care Medicine Staff Physician, [facility] Center Director - Interstitial Lung Disease  Program  Pulmonary Fibrosis [facility] Network at [facility] [place], [place], [address]  Pager: [phone], If Robertson answer  OR between  19:00-7:00h:  page [phone] Telephone (clinical office): [phone] Telephone (research): [phone]  10:20 AM 01/30/2021

## 2021-01-30 NOTE — Plan of Care (Signed)

## 2021-01-30 NOTE — Progress Notes (Signed)
Chest tube suction turned off at this time per verbal order by pulmonary doctor. Will continue to monitor.

## 2021-01-30 NOTE — Progress Notes (Signed)
PROGRESS NOTE    Omar Robertson  TSV:[id number] DOB: March 18, 1981 DOA: 01/28/2021 PCP: No primary care provider on file.    Brief Narrative:  40 y.o. male with a history of brain cyst s/p resection and long history of seizure disorder who presented to the ED 5/30 having seizures every 5 hours or so after running out of seizure medications 3 days prior. Work up also revealed moderate left-sided pneumothorax, though the patient did not report traumatic history or history of pneumothorax, though the patient had no respiratory complaints or hypoxia. The patient was admitted after consultation with trauma surgery. He's had no seizures since receiving AEDs and PTX is roughly stable on CXR the following morning. Pulmonology recommended 100% FiO2 and repeat radiograph in hopes of resorption to avoiding thoracostomy tube.  Assessment & Plan:   Principal Problem:   Recurrent seizures (HCC) Active Problems:   Epilepsy (HCC)   Traumatic brain injury with loss of consciousness (HCC)   Pneumothorax  Seizure disorder with seizures in setting of running out of AEDs:  - Improved now that we've reinstated AEDs. Will therefore, not consult neurology at this time.  - Pt will need to f/u with Neurology. Also has appointment to establish care with PCP.   Pneumothorax:  -Noted to be spontaneous  - Pulmonary is following. Pt is currently with chest tube in place - Discussed with Dr. Marchelle Gearing. Plan to turn suction off today and f/u on CXR this afternoon. If CXR remain well expanded, then keep suction off througu 6/2  DVT prophylaxis: Lovenox subq Code Status: Full Family Communication: Pt in room, family not at bedside  Status is: Inpatient  Remains inpatient appropriate because:Inpatient level of care appropriate due to severity of illness   Dispo: The patient is from: Home              Anticipated d/c is to: Home              Patient currently is not medically stable to d/c.   Difficult  to place patient No       Consultants:   Pulmonary  Procedures:   Chest tube placement  Antimicrobials: Anti-infectives (From admission, onward)   None       Subjective: Without complaints this AM  Objective: Vitals:   01/29/21 2256 01/30/21 0319 01/30/21 0810 01/30/21 1208  BP: (!) 143/91 (!) 166/94 (!) 130/93 130/87  Pulse: 70 71 69 77  Resp: 20 20 (!) 21 20  Temp: 98.8 F (37.1 C) 97.9 F (36.6 C) 98.7 F (37.1 C) 97.8 F (36.6 C)  TempSrc: Axillary Oral Oral Oral  SpO2: 98%  99% 100%  Weight:      Height:        Intake/Output Summary (Last 24 hours) at 01/30/2021 1415 Last data filed at 01/30/2021 1200 Gross per 24 hour  Intake 10 ml  Output 1600 ml  Net -1590 ml   Filed Weights   01/28/21 1815  Weight: 72.6 kg    Examination: General exam: Awake, laying in bed, in nad Respiratory system: Normal respiratory effort, no wheezing, chest tube in place Cardiovascular system: regular rate, s1, s2 Gastrointestinal system: Soft, nondistended, positive BS Central nervous system: CN2-12 grossly intact, strength intact Extremities: Perfused, no clubbing Skin: Normal skin turgor, no notable skin lesions seen Psychiatry: Mood normal // no visual hallucinations    Data Reviewed: I have personally reviewed following labs and imaging studies  CBC: Recent Labs  Lab 01/28/21 1820  WBC 16.7*  NEUTROABS 12.2*  HGB 15.4  HCT 47.8  MCV 86.6  PLT 319   Basic Metabolic Panel: Recent Labs  Lab 01/28/21 1820 01/29/21 0155  NA 138 140  K 4.0 4.6  CL 107 107  CO2 20* 24  GLUCOSE 119* 109*  BUN 20 18  CREATININE 1.55* 1.07  CALCIUM 9.7 9.3  MG 2.5*  --    GFR: Estimated Creatinine Clearance: 93.3 mL/min (by C-G formula based on SCr of 1.07 mg/dL). Liver Function Tests: Recent Labs  Lab 01/28/21 1820  AST 21  ALT 18  ALKPHOS 59  BILITOT 0.5  PROT 9.4*  ALBUMIN 5.2*   No results for input(s): LIPASE, AMYLASE in the last 168 hours. No results  for input(s): AMMONIA in the last 168 hours. Coagulation Profile: No results for input(s): INR, PROTIME in the last 168 hours. Cardiac Enzymes: No results for input(s): CKTOTAL, CKMB, CKMBINDEX, TROPONINI in the last 168 hours. BNP (last 3 results) No results for input(s): PROBNP in the last 8760 hours. HbA1C: No results for input(s): HGBA1C in the last 72 hours. CBG: Recent Labs  Lab 01/28/21 1846  GLUCAP 111*   Lipid Profile: No results for input(s): CHOL, HDL, LDLCALC, TRIG, CHOLHDL, LDLDIRECT in the last 72 hours. Thyroid Function Tests: No results for input(s): TSH, T4TOTAL, FREET4, T3FREE, THYROIDAB in the last 72 hours. Anemia Panel: No results for input(s): VITAMINB12, FOLATE, FERRITIN, TIBC, IRON, RETICCTPCT in the last 72 hours. Sepsis Labs: No results for input(s): PROCALCITON, LATICACIDVEN in the last 168 hours.  Recent Results (from the past 240 hour(s))  Resp Panel by RT-PCR (Flu A&B, Covid) Nasopharyngeal Swab     Status: None   Collection Time: 01/28/21  8:20 PM   Specimen: Nasopharyngeal Swab; Nasopharyngeal(NP) swabs in vial transport medium  Result Value Ref Range Status   SARS Coronavirus 2 by RT PCR NEGATIVE NEGATIVE Final    Comment: (NOTE) SARS-CoV-2 target nucleic acids are NOT DETECTED.  The SARS-CoV-2 RNA is generally detectable in upper respiratory specimens during the acute phase of infection. The lowest concentration of SARS-CoV-2 viral copies this assay can detect is 138 copies/mL. A negative result does not preclude SARS-Cov-2 infection and should not be used as the sole basis for treatment or other patient management decisions. A negative result may occur with  improper specimen collection/handling, submission of specimen other than nasopharyngeal swab, presence of viral mutation(s) within the areas targeted by this assay, and inadequate number of viral copies(<138 copies/mL). A negative result must be combined with clinical observations,  patient history, and epidemiological information. The expected result is Negative.  Fact Sheet for Patients:  BloggerCourse.com  Fact Sheet for Healthcare Providers:  SeriousBroker.it  This test is no t yet approved or cleared by the [place] FDA and  has been authorized for detection and/or diagnosis of SARS-CoV-2 by FDA under an Emergency Use Authorization (EUA). This EUA will remain  in effect (meaning this test can be used) for the duration of the COVID-19 declaration under Section 564(b)(1) of the Act, 21 U.S.C.section 360bbb-3(b)(1), unless the authorization is terminated  or revoked sooner.       Influenza A by PCR NEGATIVE NEGATIVE Final   Influenza B by PCR NEGATIVE NEGATIVE Final    Comment: (NOTE) The Xpert Xpress SARS-CoV-2/FLU/RSV plus assay is intended as an aid in the diagnosis of influenza from Nasopharyngeal swab specimens and should not be used as a sole basis for treatment. Nasal washings and aspirates are unacceptable for Xpert Xpress SARS-CoV-2/FLU/RSV testing.  Fact Sheet for Patients: BloggerCourse.com  Fact Sheet for Healthcare Providers: SeriousBroker.it  This test is not yet approved or cleared by the [place] FDA and has been authorized for detection and/or diagnosis of SARS-CoV-2 by FDA under an Emergency Use Authorization (EUA). This EUA will remain in effect (meaning this test can be used) for the duration of the COVID-19 declaration under Section 564(b)(1) of the Act, 21 U.S.C. section 360bbb-3(b)(1), unless the authorization is terminated or revoked.  Performed at [facility], [address] [place], [place] [address]      Radiology Studies: DG CHEST PORT 1 VIEW  Result Date: 01/29/2021 CLINICAL DATA:  Chest tube placement.  Altered mental status EXAM: PORTABLE CHEST 1 VIEW COMPARISON:  01/29/2021 FINDINGS: Interval  placement of left chest tube. Re-expansion of the left lung. No visible pneumothorax although the left apex is not imaged on this image. Left base atelectasis noted. Right lung clear. Heart is borderline in size. IMPRESSION: Placement of left chest tube with re-expansion of the left lung. No visible pneumothorax although the left apex is excluded. Electronically Signed   By: Charlett Nose M.D.   On: 01/29/2021 19:07   DG CHEST PORT 1 VIEW  Result Date: 01/29/2021 CLINICAL DATA:  Pneumothorax EXAM: PORTABLE CHEST - 1 VIEW COMPARISON:  Earlier film of the same day FINDINGS: There is still a moderate left pneumothorax. The apex of the lung projects at the level of the left fifth rib posterior aspect as before. There is persistent left retrocardiac consolidation/atelectasis with air bronchograms. No definite effusion. The right lung remains clear. Heart size and mediastinal contours are within normal limits. No mediastinal shift. Visualized bones unremarkable. IMPRESSION: Stable moderate left pneumothorax. Electronically Signed   By: Corlis Leak M.D.   On: 01/29/2021 15:25   DG CHEST PORT 1 VIEW  Result Date: 01/29/2021 CLINICAL DATA:  Pneumothorax. EXAM: PORTABLE CHEST 1 VIEW COMPARISON:  01/28/2021. FINDINGS: Mediastinum hilar structures normal. Stable cardiomegaly. Low lung volumes with bibasilar atelectasis. No pleural effusion. Moderate left-sided pneumothorax is again noted without interim change. IMPRESSION: 1. Moderate left-sided pneumothorax again noted without interim change. 2.  Low lung volumes with bibasilar atelectasis. 3.  Stable cardiomegaly. Electronically Signed   By: Maisie Fus  Register   On: 01/29/2021 07:15   DG Chest Portable 1 View  Result Date: 01/28/2021 CLINICAL DATA:  Repeat radiograph, assess left-sided pneumothorax EXAM: PORTABLE CHEST 1 VIEW COMPARISON:  01/28/2021, 6:38 p.m. FINDINGS: The heart size and mediastinal contours are within normal limits. Unchanged small to moderate left  pneumothorax, approximately 20% volume. The right lung is normally aerated. The visualized skeletal structures are unremarkable. IMPRESSION: Unchanged small to moderate left pneumothorax, approximately 20% volume. The right lung is normally aerated. Electronically Signed   By: Lauralyn Primes M.D.   On: 01/28/2021 20:41   DG Chest Port 1 View  Result Date: 01/28/2021 CLINICAL DATA:  Recent seizure activity EXAM: PORTABLE CHEST 1 VIEW COMPARISON:  07/11/2020 FINDINGS: Cardiac shadow is within normal limits. Right lung is well aerated. Left lung demonstrates a moderate-sized pneumothorax with approximately 4.8 cm of excursion from the apex. No focal infiltrate is seen. Mild atelectasis is noted in the left base. No bony abnormality is noted. IMPRESSION: New left pneumothorax as described. Critical Value/emergent results were called by telephone at the time of interpretation on 01/28/2021 at 7:25 pm to Dr. Alvester Chou , who verbally acknowledged these results. Electronically Signed   By: Alcide Clever M.D.   On: 01/28/2021 19:25  Scheduled Meds: . enoxaparin (LOVENOX) injection  40 mg Subcutaneous Q24H  . escitalopram  20 mg Oral Daily  . lacosamide  100 mg Oral QHS  . lacosamide  150 mg Oral Daily  . levETIRAcetam  1,000 mg Oral BID  . sodium chloride flush  10 mL Intracatheter Q8H   Continuous Infusions:   LOS: 2 days   Rickey BarbaraStephen Rashan Patient, MD [facility] Pager On Amion  If 7PM-7AM, please contact night-coverage 01/30/2021, 2:15 PM

## 2021-01-31 ENCOUNTER — Inpatient Hospital Stay (HOSPITAL_COMMUNITY): Payer: Self-pay

## 2021-01-31 ENCOUNTER — Other Ambulatory Visit (HOSPITAL_COMMUNITY): Payer: Self-pay

## 2021-01-31 DIAGNOSIS — Z938 Other artificial opening status: Secondary | ICD-10-CM

## 2021-01-31 LAB — CBC
HCT: 41.8 % (ref 39.0–52.0)
Hemoglobin: 13.3 g/dL (ref 13.0–17.0)
MCH: 27.4 pg (ref 26.0–34.0)
MCHC: 31.8 g/dL (ref 30.0–36.0)
MCV: 86 fL (ref 80.0–100.0)
Platelets: 271 10*3/uL (ref 150–400)
RBC: 4.86 MIL/uL (ref 4.22–5.81)
RDW: 12.5 % (ref 11.5–15.5)
WBC: 8 10*3/uL (ref 4.0–10.5)
nRBC: 0 % (ref 0.0–0.2)

## 2021-01-31 LAB — COMPREHENSIVE METABOLIC PANEL
ALT: 16 U/L (ref 0–44)
AST: 16 U/L (ref 15–41)
Albumin: 3.5 g/dL (ref 3.5–5.0)
Alkaline Phosphatase: 47 U/L (ref 38–126)
Anion gap: 9 (ref 5–15)
BUN: 12 mg/dL (ref 6–20)
CO2: 27 mmol/L (ref 22–32)
Calcium: 9 mg/dL (ref 8.9–10.3)
Chloride: 101 mmol/L (ref 98–111)
Creatinine, Ser: 0.9 mg/dL (ref 0.61–1.24)
GFR, Estimated: >60 mL/min (ref >60)
Glucose, Bld: 100 mg/dL — ABNORMAL HIGH (ref 70–99)
Potassium: 3.4 mmol/L — ABNORMAL LOW (ref 3.5–5.1)
Sodium: 137 mmol/L (ref 135–145)
Total Bilirubin: 0.5 mg/dL (ref 0.3–1.2)
Total Protein: 6.9 g/dL (ref 6.5–8.1)

## 2021-01-31 MED ORDER — LACOSAMIDE 100 MG PO TABS
100.0000 mg | ORAL_TABLET | Freq: Two times a day (BID) | ORAL | 0 refills | Status: DC
  Filled 2021-01-31 (×4): qty 60, 30d supply, fill #0

## 2021-01-31 MED ORDER — POTASSIUM CHLORIDE CRYS ER 20 MEQ PO TBCR
60.0000 meq | EXTENDED_RELEASE_TABLET | Freq: Once | ORAL | Status: AC
  Administered 2021-01-31: 60 meq via ORAL
  Filled 2021-01-31: qty 3

## 2021-01-31 MED ORDER — LEVETIRACETAM 1000 MG PO TABS
1000.0000 mg | ORAL_TABLET | Freq: Two times a day (BID) | ORAL | 0 refills | Status: DC
  Filled 2021-01-31: qty 60, 30d supply, fill #0

## 2021-01-31 MED ORDER — LACOSAMIDE 150 MG PO TABS
150.0000 mg | ORAL_TABLET | Freq: Every day | ORAL | 0 refills | Status: DC
  Filled 2021-01-31: qty 30, 30d supply, fill #0

## 2021-01-31 MED ORDER — LACOSAMIDE 100 MG PO TABS
100.0000 mg | ORAL_TABLET | Freq: Every day | ORAL | 0 refills | Status: DC
  Filled 2021-01-31: qty 30, 30d supply, fill #0

## 2021-01-31 NOTE — Progress Notes (Signed)
No more ptx   Plan  - ccm wil sign off  - avodi lfiting and valsalva and trauma for atleast 1-2 weeks    SIGNATURE    Dr. Kalman Shan, M.D., F.C.C.P,  Pulmonary and Critical Care Medicine Staff Physician, [facility] Director - Interstitial Lung Disease  Program  Pulmonary Fibrosis [facility] at [facility] [place], [place], [address]  Pager: [phone], If no answer  OR between  19:00-7:00h: page [phone] Telephone (clinical office): [phone] Telephone (research): [phone]  4:11 PM 01/31/2021   DG CHEST PORT 1 VIEW  Result Date: 01/31/2021 CLINICAL DATA:  Status post left chest tube removal. EXAM: PORTABLE CHEST 1 VIEW COMPARISON:  Single-view of the chest earlier today. FINDINGS: Left chest tube has been removed. No pneumothorax. Linear scar or atelectasis in the lung bases is greater on the left and unchanged. No pleural fluid. Heart size is normal. IMPRESSION: Negative for pneumothorax after left chest tube removal. No new abnormality. Electronically Signed   By: Drusilla Kanner M.D.   On: 01/31/2021 15:49   DG CHEST PORT 1 VIEW  Result Date: 01/31/2021 CLINICAL DATA:  Pneumothorax, chest tube clamped EXAM: PORTABLE CHEST 1 VIEW COMPARISON:  Portable exam 1210 hours compared to 01/30/2021 FINDINGS: Pigtail LEFT thoracostomy tube again seen. Normal heart size, mediastinal contours, and pulmonary vascularity. Persistent LEFT basilar atelectasis. No pulmonary infiltrate, pleural effusion, or pneumothorax. Osseous structures unremarkable. IMPRESSION: LEFT thoracostomy tube without pneumothorax. Persistent LEFT basilar atelectasis. Electronically Signed   By: Ulyses Southward M.D.   On: 01/31/2021 12:18   DG CHEST PORT 1 VIEW  Result Date: 01/30/2021 CLINICAL DATA:  Follow-up pneumothorax EXAM: PORTABLE CHEST 1 VIEW COMPARISON:  Film from the previous day. FINDINGS: Pigtail catheter is noted on the left and stable. No pneumothorax  is noted at this time. Bibasilar atelectasis is noted increased on the right when compared with the prior exam. No acute bony abnormality is seen. IMPRESSION: No left-sided pneumothorax is noted. Bibasilar atelectasis increased on the right when compared with the previous day. Electronically Signed   By: Alcide Clever M.D.   On: 01/30/2021 15:24   DG CHEST PORT 1 VIEW  Result Date: 01/29/2021 CLINICAL DATA:  Chest tube placement.  Altered mental status EXAM: PORTABLE CHEST 1 VIEW COMPARISON:  01/29/2021 FINDINGS: Interval placement of left chest tube. Re-expansion of the left lung. No visible pneumothorax although the left apex is not imaged on this image. Left base atelectasis noted. Right lung clear. Heart is borderline in size. IMPRESSION: Placement of left chest tube with re-expansion of the left lung. No visible pneumothorax although the left apex is excluded. Electronically Signed   By: Charlett Nose M.D.   On: 01/29/2021 19:07   DG CHEST PORT 1 VIEW  Result Date: 01/29/2021 CLINICAL DATA:  Pneumothorax EXAM: PORTABLE CHEST - 1 VIEW COMPARISON:  Earlier film of the same day FINDINGS: There is still a moderate left pneumothorax. The apex of the lung projects at the level of the left fifth rib posterior aspect as before. There is persistent left retrocardiac consolidation/atelectasis with air bronchograms. No definite effusion. The right lung remains clear. Heart size and mediastinal contours are within normal limits. No mediastinal shift. Visualized bones unremarkable. IMPRESSION: Stable moderate left pneumothorax. Electronically Signed   By: Corlis Leak M.D.   On: 01/29/2021 15:25   DG CHEST PORT 1 VIEW  Result Date: 01/29/2021 CLINICAL DATA:  Pneumothorax. EXAM: PORTABLE  CHEST 1 VIEW COMPARISON:  01/28/2021. FINDINGS: Mediastinum hilar structures normal. Stable cardiomegaly. Low lung volumes with bibasilar atelectasis. No pleural effusion. Moderate left-sided pneumothorax is again noted without  interim change. IMPRESSION: 1. Moderate left-sided pneumothorax again noted without interim change. 2.  Low lung volumes with bibasilar atelectasis. 3.  Stable cardiomegaly. Electronically Signed   By: Maisie Fus  Register   On: 01/29/2021 07:15   DG Chest Portable 1 View  Result Date: 01/28/2021 CLINICAL DATA:  Repeat radiograph, assess left-sided pneumothorax EXAM: PORTABLE CHEST 1 VIEW COMPARISON:  01/28/2021, 6:38 p.m. FINDINGS: The heart size and mediastinal contours are within normal limits. Unchanged small to moderate left pneumothorax, approximately 20% volume. The right lung is normally aerated. The visualized skeletal structures are unremarkable. IMPRESSION: Unchanged small to moderate left pneumothorax, approximately 20% volume. The right lung is normally aerated. Electronically Signed   By: Lauralyn Primes M.D.   On: 01/28/2021 20:41   DG Chest Port 1 View  Result Date: 01/28/2021 CLINICAL DATA:  Recent seizure activity EXAM: PORTABLE CHEST 1 VIEW COMPARISON:  07/11/2020 FINDINGS: Cardiac shadow is within normal limits. Right lung is well aerated. Left lung demonstrates a moderate-sized pneumothorax with approximately 4.8 cm of excursion from the apex. No focal infiltrate is seen. Mild atelectasis is noted in the left base. No bony abnormality is noted. IMPRESSION: New left pneumothorax as described. Critical Value/emergent results were called by telephone at the time of interpretation on 01/28/2021 at 7:25 pm to Dr. Alvester Chou , who verbally acknowledged these results. Electronically Signed   By: Alcide Clever M.D.   On: 01/28/2021 19:25

## 2021-01-31 NOTE — Discharge Summary (Addendum)
Physician Discharge Summary  Omar Robertson DZH:[id number] DOB: 1981/07/01 DOA: 01/28/2021  PCP: No primary care provider on file.  Admit date: 01/28/2021 Discharge date: 01/31/2021  Admitted From: Home Disposition:  Home  Recommendations for Outpatient Follow-up:  1. Follow up with PCP in 1-2 weeks 2. Follow up with Neurology as scheduled  Discharge Condition:Stable CODE STATUS:Full Diet recommendation: Regular   Brief/Interim Summary: 40 y.o.malewith a history of brain cyst s/p resection and long history of seizure disorder who presented to the ED 5/30 having seizures every 5 hours or so after running out of seizure medications 3 days prior. Work up also revealed moderate left-sided pneumothorax, though the patient did not report traumatic history or history of pneumothorax, though the patient had no respiratory complaints or hypoxia. The patient was admitted after consultation with trauma surgery. He's had no seizures since receiving AEDs and PTX is roughly stable on CXR the following morning. Pulmonology recommended 100% FiO2 and repeat radiograph in hopes of resorption to avoiding thoracostomy tube.  Discharge Diagnoses:  Principal Problem:   Recurrent seizures (HCC) Active Problems:   Epilepsy (HCC)   Traumatic brain injury with loss of consciousness (HCC)   Pneumothorax  Seizure disorder with seizures in setting of running out of AEDs:  - Improved now that we've reinstated AEDs. Will therefore, did not consult neurology at this time.  - Would have pt f/u with Neurology as outpatient. Also has appointment to establish care with PCP.   UPDATE: Per pharmacist, cost of vimpat may be prohibitive for pt. Would discharge on slightly lower dose which would be fully covered and have pt f/u with [facility] as per [facility] documentation for further refills  Pneumothorax:  -Noted to be spontaneous  - Pulmonary had been following, required chest tube placement.  -  Pneumothorax resolved on review of serial CXR and chest tube was later removed by Pulmonary. Pt has since been clear for d/c  Discharge Instructions   Allergies as of 01/31/2021   No Known Allergies     Medication List    TAKE these medications   escitalopram 20 MG tablet Commonly known as: LEXAPRO Tome 1 tableta (20 mg en total) por va oral diariamente. (Take 1 tablet (20 mg total) by mouth daily.)   Lacosamide 100 MG Tabs Commonly known as: Vimpat Take 1 tablet (100 mg total) by mouth 2 (two) times daily. What changed:   when to take this  Another medication with the same name was removed. Continue taking this medication, and follow the directions you see here.   levETIRAcetam 1000 MG tablet Commonly known as: Keppra Take 1 tablet (1,000 mg total) by mouth 2 (two) times daily.       Follow-up Information    [facility]. Go on 02/28/2021.   Why: Please attend your appointment with Dr. Delford Field on Thursday, June 30, at 10am.  Please ask about medication refills at this appointment.   Contact information: [address] [place] [address] [phone]             No Known Allergies  Consultations:  Pulmonary  Procedures/Studies: DG CHEST PORT 1 VIEW  Result Date: 01/31/2021 CLINICAL DATA:  Status post left chest tube removal. EXAM: PORTABLE CHEST 1 VIEW COMPARISON:  Single-view of the chest earlier today. FINDINGS: Left chest tube has been removed. No pneumothorax. Linear scar or atelectasis in the lung bases is greater on the left and unchanged. No pleural fluid. Heart size is  normal. IMPRESSION: Negative for pneumothorax after left chest tube removal. No new abnormality. Electronically Signed   By: Drusilla Kanner M.D.   On: 01/31/2021 15:49   DG CHEST PORT 1 VIEW  Result Date: 01/31/2021 CLINICAL DATA:  Pneumothorax, chest tube clamped EXAM: PORTABLE CHEST 1 VIEW COMPARISON:  Portable exam 1210 hours compared  to 01/30/2021 FINDINGS: Pigtail LEFT thoracostomy tube again seen. Normal heart size, mediastinal contours, and pulmonary vascularity. Persistent LEFT basilar atelectasis. No pulmonary infiltrate, pleural effusion, or pneumothorax. Osseous structures unremarkable. IMPRESSION: LEFT thoracostomy tube without pneumothorax. Persistent LEFT basilar atelectasis. Electronically Signed   By: Ulyses Southward M.D.   On: 01/31/2021 12:18   DG CHEST PORT 1 VIEW  Result Date: 01/30/2021 CLINICAL DATA:  Follow-up pneumothorax EXAM: PORTABLE CHEST 1 VIEW COMPARISON:  Film from the previous day. FINDINGS: Pigtail catheter is noted on the left and stable. No pneumothorax is noted at this time. Bibasilar atelectasis is noted increased on the right when compared with the prior exam. No acute bony abnormality is seen. IMPRESSION: No left-sided pneumothorax is noted. Bibasilar atelectasis increased on the right when compared with the previous day. Electronically Signed   By: Alcide Clever M.D.   On: 01/30/2021 15:24   DG CHEST PORT 1 VIEW  Result Date: 01/29/2021 CLINICAL DATA:  Chest tube placement.  Altered mental status EXAM: PORTABLE CHEST 1 VIEW COMPARISON:  01/29/2021 FINDINGS: Interval placement of left chest tube. Re-expansion of the left lung. No visible pneumothorax although the left apex is not imaged on this image. Left base atelectasis noted. Right lung clear. Heart is borderline in size. IMPRESSION: Placement of left chest tube with re-expansion of the left lung. No visible pneumothorax although the left apex is excluded. Electronically Signed   By: Charlett Nose M.D.   On: 01/29/2021 19:07   DG CHEST PORT 1 VIEW  Result Date: 01/29/2021 CLINICAL DATA:  Pneumothorax EXAM: PORTABLE CHEST - 1 VIEW COMPARISON:  Earlier film of the same day FINDINGS: There is still a moderate left pneumothorax. The apex of the lung projects at the level of the left fifth rib posterior aspect as before. There is persistent left  retrocardiac consolidation/atelectasis with air bronchograms. No definite effusion. The right lung remains clear. Heart size and mediastinal contours are within normal limits. No mediastinal shift. Visualized bones unremarkable. IMPRESSION: Stable moderate left pneumothorax. Electronically Signed   By: Corlis Leak M.D.   On: 01/29/2021 15:25   DG CHEST PORT 1 VIEW  Result Date: 01/29/2021 CLINICAL DATA:  Pneumothorax. EXAM: PORTABLE CHEST 1 VIEW COMPARISON:  01/28/2021. FINDINGS: Mediastinum hilar structures normal. Stable cardiomegaly. Low lung volumes with bibasilar atelectasis. No pleural effusion. Moderate left-sided pneumothorax is again noted without interim change. IMPRESSION: 1. Moderate left-sided pneumothorax again noted without interim change. 2.  Low lung volumes with bibasilar atelectasis. 3.  Stable cardiomegaly. Electronically Signed   By: Maisie Fus  Register   On: 01/29/2021 07:15   DG Chest Portable 1 View  Result Date: 01/28/2021 CLINICAL DATA:  Repeat radiograph, assess left-sided pneumothorax EXAM: PORTABLE CHEST 1 VIEW COMPARISON:  01/28/2021, 6:38 p.m. FINDINGS: The heart size and mediastinal contours are within normal limits. Unchanged small to moderate left pneumothorax, approximately 20% volume. The right lung is normally aerated. The visualized skeletal structures are unremarkable. IMPRESSION: Unchanged small to moderate left pneumothorax, approximately 20% volume. The right lung is normally aerated. Electronically Signed   By: Lauralyn Primes M.D.   On: 01/28/2021 20:41   DG Chest [facility] [address]  Result Date: 01/28/2021 CLINICAL DATA:  Recent seizure activity EXAM: PORTABLE CHEST 1 VIEW COMPARISON:  07/11/2020 FINDINGS: Cardiac shadow is within normal limits. Right lung is well aerated. Left lung demonstrates a moderate-sized pneumothorax with approximately 4.8 cm of excursion from the apex. No focal infiltrate is seen. Mild atelectasis is noted in the left base. No bony abnormality is  noted. IMPRESSION: New left pneumothorax as described. Critical Value/emergent results were called by telephone at the time of interpretation on 01/28/2021 at 7:25 pm to Dr. Alvester Chou , who verbally acknowledged these results. Electronically Signed   By: Alcide Clever M.D.   On: 01/28/2021 19:25    Subjective: Without complaints this AM  Discharge Exam: Vitals:   01/31/21 1355 01/31/21 1415  BP: 121/79 119/87  Pulse: 80 78  Resp: (!) 21 (!) 22  Temp: 98.5 F (36.9 C)   SpO2: 97% 97%   Vitals:   01/31/21 1319 01/31/21 1320 01/31/21 1355 01/31/21 1415  BP: 139/82 139/82 121/79 119/87  Pulse: 90 84 80 78  Resp: (!) 26 (!) 22 (!) 21 (!) 22  Temp:   98.5 F (36.9 C)   TempSrc:   Oral   SpO2: 98% 98% 97% 97%  Weight:      Height:        General: Pt is alert, awake, not in acute distress Cardiovascular: RRR, S1/S2 + Respiratory: CTA bilaterally, no wheezing, no rhonchi Abdominal: Soft, NT, ND, bowel sounds + Extremities: no edema, no cyanosis   The results of significant diagnostics from this hospitalization (including imaging, microbiology, ancillary and laboratory) are listed below for reference.     Microbiology: Recent Results (from the past 240 hour(s))  Resp Panel by RT-PCR (Flu A&B, Covid) Nasopharyngeal Swab     Status: None   Collection Time: 01/28/21  8:20 PM   Specimen: Nasopharyngeal Swab; Nasopharyngeal(NP) swabs in vial transport medium  Result Value Ref Range Status   SARS Coronavirus 2 by RT PCR NEGATIVE NEGATIVE Final    Comment: (NOTE) SARS-CoV-2 target nucleic acids are NOT DETECTED.  The SARS-CoV-2 RNA is generally detectable in upper respiratory specimens during the acute phase of infection. The lowest concentration of SARS-CoV-2 viral copies this assay can detect is 138 copies/mL. A negative result does not preclude SARS-Cov-2 infection and should not be used as the sole basis for treatment or other patient management decisions. A negative  result may occur with  improper specimen collection/handling, submission of specimen other than nasopharyngeal swab, presence of viral mutation(s) within the areas targeted by this assay, and inadequate number of viral copies(<138 copies/mL). A negative result must be combined with clinical observations, patient history, and epidemiological information. The expected result is Negative.  Fact Sheet for Patients:  BloggerCourse.com  Fact Sheet for Healthcare Providers:  SeriousBroker.it  This test is no t yet approved or cleared by the [place] FDA and  has been authorized for detection and/or diagnosis of SARS-CoV-2 by FDA under an Emergency Use Authorization (EUA). This EUA will remain  in effect (meaning this test can be used) for the duration of the COVID-19 declaration under Section 564(b)(1) of the Act, 21 U.S.C.section 360bbb-3(b)(1), unless the authorization is terminated  or revoked sooner.       Influenza A by PCR NEGATIVE NEGATIVE Final   Influenza B by PCR NEGATIVE NEGATIVE Final    Comment: (NOTE) The Xpert Xpress SARS-CoV-2/FLU/RSV plus assay is intended as an aid in the diagnosis of influenza from Nasopharyngeal swab specimens and should not  be used as a sole basis for treatment. Nasal washings and aspirates are unacceptable for Xpert Xpress SARS-CoV-2/FLU/RSV testing.  Fact Sheet for Patients: BloggerCourse.comhttps://www.fda.gov/media/152166/download  Fact Sheet for Healthcare Providers: SeriousBroker.ithttps://www.fda.gov/media/152162/download  This test is not yet approved or cleared by the [place] FDA and has been authorized for detection and/or diagnosis of SARS-CoV-2 by FDA under an Emergency Use Authorization (EUA). This EUA will remain in effect (meaning this test can be used) for the duration of the COVID-19 declaration under Section 564(b)(1) of the Act, 21 U.S.C. section 360bbb-3(b)(1), unless the authorization is  terminated or revoked.  Performed at [facility] [place], [address] [place], [place] [id number]      Labs: BNP (last 3 results) No results for input(s): BNP in the last 8760 hours. Basic Metabolic Panel: Recent Labs  Lab 01/28/21 1820 01/29/21 0155 01/31/21 0230  NA 138 140 137  K 4.0 4.6 3.4*  CL 107 107 101  CO2 20* 24 27  GLUCOSE 119* 109* 100*  BUN 20 18 12   CREATININE 1.55* 1.07 0.90  CALCIUM 9.7 9.3 9.0  MG 2.5*  --   --    Liver Function Tests: Recent Labs  Lab 01/28/21 1820 01/31/21 0230  AST 21 16  ALT 18 16  ALKPHOS 59 47  BILITOT 0.5 0.5  PROT 9.4* 6.9  ALBUMIN 5.2* 3.5   No results for input(s): LIPASE, AMYLASE in the last 168 hours. No results for input(s): AMMONIA in the last 168 hours. CBC: Recent Labs  Lab 01/28/21 1820 01/31/21 0230  WBC 16.7* 8.0  NEUTROABS 12.2*  --   HGB 15.4 13.3  HCT 47.8 41.8  MCV 86.6 86.0  PLT 319 271   Cardiac Enzymes: No results for input(s): CKTOTAL, CKMB, CKMBINDEX, TROPONINI in the last 168 hours. BNP: Invalid input(s): POCBNP CBG: Recent Labs  Lab 01/28/21 1846  GLUCAP 111*   D-Dimer No results for input(s): DDIMER in the last 72 hours. Hgb A1c No results for input(s): HGBA1C in the last 72 hours. Lipid Profile No results for input(s): CHOL, HDL, LDLCALC, TRIG, CHOLHDL, LDLDIRECT in the last 72 hours. Thyroid function studies No results for input(s): TSH, T4TOTAL, T3FREE, THYROIDAB in the last 72 hours.  Invalid input(s): FREET3 Anemia work up No results for input(s): VITAMINB12, FOLATE, FERRITIN, TIBC, IRON, RETICCTPCT in the last 72 hours. Urinalysis No results found for: COLORURINE, APPEARANCEUR, LABSPEC, PHURINE, GLUCOSEU, HGBUR, BILIRUBINUR, KETONESUR, PROTEINUR, UROBILINOGEN, NITRITE, LEUKOCYTESUR Sepsis Labs Invalid input(s): PROCALCITONIN,  WBC,  LACTICIDVEN Microbiology Recent Results (from the past 240 hour(s))  Resp Panel by RT-PCR (Flu A&B, Covid) Nasopharyngeal Swab      Status: None   Collection Time: 01/28/21  8:20 PM   Specimen: Nasopharyngeal Swab; Nasopharyngeal(NP) swabs in vial transport medium  Result Value Ref Range Status   SARS Coronavirus 2 by RT PCR NEGATIVE NEGATIVE Final    Comment: (NOTE) SARS-CoV-2 target nucleic acids are NOT DETECTED.  The SARS-CoV-2 RNA is generally detectable in upper respiratory specimens during the acute phase of infection. The lowest concentration of SARS-CoV-2 viral copies this assay can detect is 138 copies/mL. A negative result does not preclude SARS-Cov-2 infection and should not be used as the sole basis for treatment or other patient management decisions. A negative result may occur with  improper specimen collection/handling, submission of specimen other than nasopharyngeal swab, presence of viral mutation(s) within the areas targeted by this assay, and inadequate number of viral copies(<138 copies/mL). A negative result must be combined with  clinical observations, patient history, and epidemiological information. The expected result is Negative.  Fact Sheet for Patients:  BloggerCourse.com  Fact Sheet for Healthcare Providers:  SeriousBroker.it  This test is no t yet approved or cleared by the [place] FDA and  has been authorized for detection and/or diagnosis of SARS-CoV-2 by FDA under an Emergency Use Authorization (EUA). This EUA will remain  in effect (meaning this test can be used) for the duration of the COVID-19 declaration under Section 564(b)(1) of the Act, 21 U.S.C.section 360bbb-3(b)(1), unless the authorization is terminated  or revoked sooner.       Influenza A by PCR NEGATIVE NEGATIVE Final   Influenza B by PCR NEGATIVE NEGATIVE Final    Comment: (NOTE) The Xpert Xpress SARS-CoV-2/FLU/RSV plus assay is intended as an aid in the diagnosis of influenza from Nasopharyngeal swab specimens and should not be used as a sole basis  for treatment. Nasal washings and aspirates are unacceptable for Xpert Xpress SARS-CoV-2/FLU/RSV testing.  Fact Sheet for Patients: BloggerCourse.com  Fact Sheet for Healthcare Providers: SeriousBroker.it  This test is not yet approved or cleared by the [place] FDA and has been authorized for detection and/or diagnosis of SARS-CoV-2 by FDA under an Emergency Use Authorization (EUA). This EUA will remain in effect (meaning this test can be used) for the duration of the COVID-19 declaration under Section 564(b)(1) of the Act, 21 U.S.C. section 360bbb-3(b)(1), unless the authorization is terminated or revoked.  Performed at [facility], [address], [place], [place] [address]    Time spent: 30 min  SIGNED:   Rickey Barbara, MD  [facility] 01/31/2021, 4:45 PM  If 7PM-7AM, please contact night-coverage

## 2021-01-31 NOTE — Plan of Care (Signed)
Discharge instruction given to the patient and his brother [place]. Discharge instruction printed in Spanish. Medication delivered by [facility] pharmacy. VS checked and within normal limit. IV and cardiac monitor discontinued.  1850: Patient discharged via wheel chair at this time. Patient hemodynamically stable during discharge.  Problem: Education: Goal: Knowledge of General Education information will improve Description: Including pain rating scale, medication(s)/side effects and non-pharmacologic comfort measures Outcome: Adequate for Discharge   Problem: Health Behavior/Discharge Planning: Goal: Ability to manage health-related needs will improve Outcome: Adequate for Discharge   Problem: Clinical Measurements: Goal: Ability to maintain clinical measurements within normal limits will improve Outcome: Adequate for Discharge Goal: Will remain free from infection Outcome: Adequate for Discharge Goal: Diagnostic test results will improve Outcome: Adequate for Discharge Goal: Respiratory complications will improve Outcome: Adequate for Discharge Goal: Cardiovascular complication will be avoided Outcome: Adequate for Discharge   Problem: Activity: Goal: Risk for activity intolerance will decrease Outcome: Adequate for Discharge   Problem: Nutrition: Goal: Adequate nutrition will be maintained Outcome: Adequate for Discharge   Problem: Coping: Goal: Level of anxiety will decrease Outcome: Adequate for Discharge   Problem: Elimination: Goal: Will not experience complications related to bowel motility Outcome: Adequate for Discharge Goal: Will not experience complications related to urinary retention Outcome: Adequate for Discharge   Problem: Pain Managment: Goal: General experience of comfort will improve Outcome: Adequate for Discharge   Problem: Safety: Goal: Ability to remain free from injury will improve Outcome: Adequate for Discharge   Problem: Skin Integrity: Goal:  Risk for impaired skin integrity will decrease Outcome: Adequate for Discharge

## 2021-01-31 NOTE — TOC Initial Note (Addendum)
Transition of Care [facility]) - Initial/Assessment Note    Patient Details  Name: Omar Robertson MRN: [id number] Date of Birth: Nov 17, 1980  Transition of Care [facility]) CM/SW Contact:    Joanne Chars, LCSW Phone Number: 01/31/2021, 2:26 PM  Clinical Narrative:   CSW met with pt at wife's request to answer questions.  Interpretor Ipad used.  Pt gives permission to speak with wife present.  Wife asking about PCP, discussed community health and wellness option and they are agreeable, also asking about medications and we discussed 30 day supply from the hospital and then follow up with [facility] pharmacy.  Pt has had covid vaccine, not booster.  Discussed that there are no DC recommendations at this time, CSW will continue to follow if other recommendations are made.  CSW spoke to [facility] and confirmed that pt has existing appt with Dr Joya Gaskins on 02/28/21 at 10am.                  Expected Discharge Plan: Home/Self Care Barriers to Discharge: Continued Medical Work up   Patient Goals and CMS Choice Patient states their goals for this hospitalization and ongoing recovery are:: "be well" CMS Medicare.gov Compare Post Acute Care list provided to::  (na-no follow up recommendations to date)    Expected Discharge Plan and Services Expected Discharge Plan: Home/Self Care     Post Acute Care Choice:  (TBD) Living arrangements for the past 2 months: Single Family Home                                      Prior Living Arrangements/Services Living arrangements for the past 2 months: Single Family Home Lives with:: Spouse Patient language and need for interpreter reviewed:: Yes (interpretor ipad used) Do you feel safe going back to the place where you live?: Yes      Need for Family Participation in Patient Care: No (Comment) Care giver support system in place?: Yes (comment) Current home services: Other (comment) (none) Criminal Activity/Legal Involvement Pertinent to Current  Situation/Hospitalization: No - Comment as needed  Activities of Daily Living      Permission Sought/Granted Permission sought to share information with : Family Supports Permission granted to share information with : Yes, Verbal Permission Granted  Share Information with NAME: wife           Emotional Assessment Appearance:: Appears stated age Attitude/Demeanor/Rapport: Engaged Affect (typically observed): Appropriate Orientation: : Oriented to Self,Oriented to Place,Oriented to  Time,Oriented to Situation Alcohol / Substance Use: Not Applicable Psych Involvement: No (comment)  Admission diagnosis:  Recurrent seizures ([place]) [G40.909] Patient Active Problem List   Diagnosis Date Noted  . Recurrent seizures ([place]) 01/28/2021  . Pneumothorax 01/28/2021  . Epilepsy ([place]) 01/22/2021  . Traumatic brain injury with loss of consciousness ([place]) 01/22/2021  . Mild episode of recurrent major depressive disorder ([place]) 01/22/2021  . Body mass index (BMI) 22.0-22.9, adult 01/22/2021   PCP:  No primary care provider on file. Pharmacy:   [facility] [place] [address] Phone: [phone] Fax: [phone]     Social Determinants of Health (SDOH) Interventions    Readmission Risk Interventions No flowsheet data found.

## 2021-01-31 NOTE — Progress Notes (Signed)
Chest tube DCd/removed. Intact, no complication, pt tolerated well. 2 RNs present. Primary RN will monitor closely

## 2021-01-31 NOTE — Consult Note (Signed)
NAME:  Omar Robertson, MRN:  [id number], DOB:  July 15, 1981, LOS: 3 ADMISSION DATE:  01/28/2021, CONSULTATION DATE:  01/29/21 REFERRING MD:  Hazeline Junker MD, CHIEF COMPLAINT:  LEft pneumothorax   BRIEF   40 year old Spanish only speaking patient.  History obtained from review of the chart, talking with the hospitalist and also through the official interpreter on [employer] services - AR[employer]ely [phone].  He has known seizure disorder with a previous history of brain surgery.  He has relocated from [place] to [place].  Does not have a local neurologist or PCP.  He is on Keppra and Vimpat.  At baseline he has 2 or 3 seizures a month.  He ran out of his Vimpat 3 days ago and started having seizures for about every 5 hours for the past 3 days.  This was associated with erratic behavior.  On the day of admission 01/28/2021 he had left-sided chest pain.  Chest x-ray showed 20% left-sided pneumothorax.  This morning pneumothorax persisted and hospitalist increase his oxygen from 2 L nasal cannula to facemask nonrebreather.  By afternoon of 01/29/2021 the pneumothorax persisted and radiologist reported as moderate persistent pneumothorax [personally visualized and agree with the findings].. His MEWS score is 0 and he is stable  Pertinent  Medical History     has a past medical history of Depression, Epilepsy (HCC), Seizure (HCC), and Seizures (HCC).   reports that he has never smoked. He has never used smokeless tobacco.  Past Surgical History:  Procedure Laterality Date  . BRAIN SURGERY      No Known Allergies   There is no immunization history on file for this patient.  Family History  Problem Relation Age of Onset  . Seizures Neg Hx      Current Facility-Administered Medications:  .  acetaminophen (TYLENOL) tablet 650 mg, 650 mg, Oral, Q6H PRN, 650 mg at 01/29/21 0129 **OR** acetaminophen (TYLENOL) suppository 650 mg, 650 mg, Rectal, Q6H PRN, Hillary Bow, DO .  enoxaparin (LOVENOX)  injection 40 mg, 40 mg, Subcutaneous, Q24H, Gardner, Jared M, DO, 40 mg at 01/31/21 7124 .  escitalopram (LEXAPRO) tablet 20 mg, 20 mg, Oral, Daily, Lyda Perone M, DO, 20 mg at 01/31/21 0825 .  HYDROcodone-acetaminophen (NORCO/VICODIN) 5-325 MG per tablet 1 tablet, 1 tablet, Oral, Q4H PRN, Karl Ito, MD, 1 tablet at 01/31/21 0825 .  ketotifen (ZADITOR) 0.025 % ophthalmic solution 1 drop, 1 drop, Both Eyes, BID, Jerald Kief, MD, 1 drop at 01/31/21 [phone] .  lacosamide (VIMPAT) tablet 100 mg, 100 mg, Oral, QHS, Gardner, Jared M, DO, 100 mg at 01/30/21 2042 .  lacosamide (VIMPAT) tablet 150 mg, 150 mg, Oral, Daily, Lyda Perone M, DO, 150 mg at 01/31/21 0826 .  levETIRAcetam (KEPPRA) tablet 1,000 mg, 1,000 mg, Oral, BID, Hillary Bow, DO, 1,000 mg at 01/31/21 0825 .  LORazepam (ATIVAN) injection 2 mg, 2 mg, Intravenous, Q6H PRN, Julian Reil, Jared M, DO .  ondansetron (ZOFRAN) tablet 4 mg, 4 mg, Oral, Q6H PRN **OR** ondansetron (ZOFRAN) injection 4 mg, 4 mg, Intravenous, Q6H PRN, Hillary Bow, DO .  sodium chloride flush (NS) 0.9 % injection 10 mL, 10 mL, Intracatheter, Q8H, Ramonia Mcclaran, MD, 10 mL at 01/31/21 0242   Significant Hospital Events: Including procedures, antibiotic start and stop dates in addition to other pertinent events   . 5/31 - left wayne cath to suction . 6/1- ad pain post chest tube. Currently denies complaints when asked in simple Spanish. Chest tube to  suction  Interim History / Subjective:    01/31/2021 - cxr done hours after chest tube clamped -> resolved Ptx left sided.  Objective   Blood pressure 127/80, pulse 99, temperature 98.7 F (37.1 C), temperature source Oral, resp. rate (!) 24, height 5\' 11"  (1.803 m), weight 72.6 kg, SpO2 100 %.        Intake/Output Summary (Last 24 hours) at 01/31/2021 1217 Last data filed at 01/31/2021 1100 Gross per 24 hour  Intake 118 ml  Output 300 ml  Net -182 ml   Filed Weights   01/28/21 1815  Weight:  72.6 kg    Examination: General Appearance:  Looks well Head:  Normocephalic, without obvious abnormality, atraumatic Eyes:  PERRL - yes, conjunctiva/corneas - clear     Ears:  Normal external ear canals, both ears Nose:  G tube - no Throat:  ETT TUBE - no , OG tube - no Neck:  Supple,  No enlargement/tenderness/nodules Lungs: Clear to auscultation bilaterally, LEft chest tub ein place Heart:  S1 and S2 normal, no murmur, CVP - no.  Pressors - no Abdomen:  Soft, no masses, no organomegaly Genitalia / Rectal:  Not done Extremities:  Extremities- intact Skin:  ntact in exposed areas . Sacral area - not examined Neurologic:  Sedation - none -> RASS - +1 . Moves all 4s - yes. CAM-ICU - neg . Orientation - x3+      Labs/imaging that I havepersonally reviewed  (right click and "Reselect all SmartList Selections" daily)    LABS    PULMONARY No results for input(s): PHART, PCO2ART, PO2ART, HCO3, TCO2, O2SAT in the last 168 hours.  Invalid input(s): PCO2, PO2  CBC Recent Labs  Lab 01/28/21 1820 01/31/21 0230  HGB 15.4 13.3  HCT 47.8 41.8  WBC 16.7* 8.0  PLT 319 271    COAGULATION No results for input(s): INR in the last 168 hours.  CARDIAC  No results for input(s): TROPONINI in the last 168 hours. No results for input(s): PROBNP in the last 168 hours.   CHEMISTRY Recent Labs  Lab 01/28/21 1820 01/29/21 0155 01/31/21 0230  NA 138 140 137  K 4.0 4.6 3.4*  CL 107 107 101  CO2 20* 24 27  GLUCOSE 119* 109* 100*  BUN 20 18 12   CREATININE 1.55* 1.07 0.90  CALCIUM 9.7 9.3 9.0  MG 2.5*  --   --    Estimated Creatinine Clearance: 110.9 mL/min (by C-G formula based on SCr of 0.9 mg/dL).   LIVER Recent Labs  Lab 01/28/21 1820 01/31/21 0230  AST 21 16  ALT 18 16  ALKPHOS 59 47  BILITOT 0.5 0.5  PROT 9.4* 6.9  ALBUMIN 5.2* 3.5     INFECTIOUS No results for input(s): LATICACIDVEN, PROCALCITON in the last 168 hours.   ENDOCRINE CBG (last 3)  Recent  Labs    01/28/21 1846  GLUCAP 111*         IMAGING x48h  - image(s) personally visualized  -   highlighted in bold DG CHEST PORT 1 VIEW  Result Date: 01/30/2021 CLINICAL DATA:  Follow-up pneumothorax EXAM: PORTABLE CHEST 1 VIEW COMPARISON:  Film from the previous day. FINDINGS: Pigtail catheter is noted on the left and stable. No pneumothorax is noted at this time. Bibasilar atelectasis is noted increased on the right when compared with the prior exam. No acute bony abnormality is seen. IMPRESSION: No left-sided pneumothorax is noted. Bibasilar atelectasis increased on the right when compared with the previous day.  Electronically Signed   By: Alcide Clever M.D.   On: 01/30/2021 15:24   DG CHEST PORT 1 VIEW  Result Date: 01/29/2021 CLINICAL DATA:  Chest tube placement.  Altered mental status EXAM: PORTABLE CHEST 1 VIEW COMPARISON:  01/29/2021 FINDINGS: Interval placement of left chest tube. Re-expansion of the left lung. No visible pneumothorax although the left apex is not imaged on this image. Left base atelectasis noted. Right lung clear. Heart is borderline in size. IMPRESSION: Placement of left chest tube with re-expansion of the left lung. No visible pneumothorax although the left apex is excluded. Electronically Signed   By: Charlett Nose M.D.   On: 01/29/2021 19:07   DG CHEST PORT 1 VIEW  Result Date: 01/29/2021 CLINICAL DATA:  Pneumothorax EXAM: PORTABLE CHEST - 1 VIEW COMPARISON:  Earlier film of the same day FINDINGS: There is still a moderate left pneumothorax. The apex of the lung projects at the level of the left fifth rib posterior aspect as before. There is persistent left retrocardiac consolidation/atelectasis with air bronchograms. No definite effusion. The right lung remains clear. Heart size and mediastinal contours are within normal limits. No mediastinal shift. Visualized bones unremarkable. IMPRESSION: Stable moderate left pneumothorax. Electronically Signed   By: Corlis Leak  M.D.   On: 01/29/2021 15:25     Resolved Hospital Problem list   x  Assessment & Plan:  LEft spontaneous pneumothorax (technically spontaneous but most likely traumatic from seizure and fall).  No evidence of rib fracture on cxr. Moderate size as of 01/29/21 /sp wayne cath to suction 01/29/21   01/31/2021 -resolved ptx even with tube clamped   Plan -remove chest tube -> hours later get another CXR   D/w Nechama Guard and bedside RN  Best practice (right click and "Reselect all SmartList Selections" daily)  Per [facility] MD [phone]  SIGNATURE    Dr. Kalman Shan, M.D., F.C.C.P,  Pulmonary and Critical Care Medicine Staff Physician, [facility] Center Director - Interstitial Lung Disease  Program  Pulmonary Fibrosis [facility] [place], [place], [address]  Pager: [phone], If no answer  OR between  19:00-7:00h: page [phone] Telephone (clinical office): [phone] Telephone (research): [phone]  12:17 PM 01/31/2021

## 2021-01-31 NOTE — Progress Notes (Signed)
Chest tube clammed at this time per order.

## 2021-02-01 ENCOUNTER — Other Ambulatory Visit (HOSPITAL_COMMUNITY): Payer: Self-pay

## 2021-02-04 ENCOUNTER — Other Ambulatory Visit: Payer: Self-pay

## 2021-02-04 ENCOUNTER — Ambulatory Visit: Payer: Self-pay | Attending: Family Medicine

## 2021-02-19 ENCOUNTER — Telehealth: Payer: Self-pay | Admitting: *Deleted

## 2021-02-19 NOTE — Telephone Encounter (Signed)
Pt was sent a letter from financial dept. Inform them, that the application they submitted was incomplete, since they were missing some documentation at the time of the appointment, Pt need to reschedule and resubmit all new papers and application for CAFA and OC, P.S. old documents has been sent back by mail to the Pt and Pt. need to make a new appt. 

## 2021-02-28 ENCOUNTER — Other Ambulatory Visit: Payer: Self-pay

## 2021-02-28 ENCOUNTER — Encounter (INDEPENDENT_AMBULATORY_CARE_PROVIDER_SITE_OTHER): Payer: Self-pay

## 2021-02-28 ENCOUNTER — Ambulatory Visit: Payer: Self-pay | Attending: Critical Care Medicine | Admitting: Critical Care Medicine

## 2021-02-28 ENCOUNTER — Encounter: Payer: Self-pay | Admitting: Critical Care Medicine

## 2021-02-28 DIAGNOSIS — G40802 Other epilepsy, not intractable, without status epilepticus: Secondary | ICD-10-CM

## 2021-02-28 DIAGNOSIS — F330 Major depressive disorder, recurrent, mild: Secondary | ICD-10-CM

## 2021-02-28 DIAGNOSIS — J9311 Primary spontaneous pneumothorax: Secondary | ICD-10-CM

## 2021-02-28 MED ORDER — LEVETIRACETAM 1000 MG PO TABS
1000.0000 mg | ORAL_TABLET | Freq: Two times a day (BID) | ORAL | 2 refills | Status: DC
  Filled 2021-02-28: qty 60, 30d supply, fill #0

## 2021-02-28 MED ORDER — LACOSAMIDE 100 MG PO TABS
100.0000 mg | ORAL_TABLET | Freq: Two times a day (BID) | ORAL | 2 refills | Status: DC
  Filled 2021-02-28 (×2): qty 60, 30d supply, fill #0
  Filled 2021-03-28: qty 60, 30d supply, fill #1

## 2021-02-28 MED ORDER — ESCITALOPRAM OXALATE 20 MG PO TABS
20.0000 mg | ORAL_TABLET | Freq: Every day | ORAL | 2 refills | Status: DC
  Filled 2021-02-28: qty 30, 30d supply, fill #0
  Filled 2021-03-28: qty 30, 30d supply, fill #1

## 2021-02-28 NOTE — Assessment & Plan Note (Signed)
Patient with seizure disorder with recent hospitalization for seizures continuing to have seizure episodes 1-2 times per month. Patient has not been able to see neurologist due to difficulty applying for financial assistance program. Omar Robertson the financial department staff member spoke with patient today to provide assistance with applying for the orange card financial assistance program. Will refer patient to neurology once the patient has received the orange card. Refills on Keppra and Vimpat sent to pharmacy today. Continue to take Vimpat 100 mg twice daily. Continue Keppra 1000 mg twice daily. Continue to monitor seizure symptoms.  Follow up at next visit.

## 2021-02-28 NOTE — Progress Notes (Signed)
New Patient Office Visit  Subjective:  Patient ID: Omar Robertson, male    DOB: December 18, 1980  Age: 40 y.o. MRN: [id number]  CC:  Chief Complaint  Patient presents with   New Patient (Initial Visit)    HPI Omar Robertson presents to the clinic today to establish care. 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 Two of the cysts were removed in [place] where he was living twelve years ago. The third cyst was not removed due to possible severe complications that could occur due to having this particular cyst removed. Since the surgery the patient has been experiencing seizures about 1-2 times each month. When the patient experiences seizures he cannot remember what he says or does. He states his seizure symptoms are not as severe when he takes his medications. He is prescribed Vimpat 100 mg twice daily and Keppra 1000 mg twice daily. The patient was hospitalized from 5/30-6/2 of this year due to multiple seizures after running out of his seizure medications. The patient has not been able to see a neurologist since he does not have insurance and has been having trouble applying for the financial assistance program. He has been taking his Keppra and Vimpat medications consistently since his hospitalization.  During his hospitalization the patient experienced a left sided pneumothorax for which he had to undergo a left sided chest tube placement. The patient had some left sided chest pain after he was discharged from the hospital. He denies any difficulty breathing or recent chest pain.   The patient has depression for which he takes Lexapro 20 mg daily consistently. The Lexapro has been helpful for his mood.   This visit was conducted using [employer] video spanish interpreters Steffany number [phone] and Elita Quick number [phone]  The following is copied from the patient's discharged  summary for his hospital stay from 01/28/2021 - 01/31/2021.  Admit date: 01/28/2021 Discharge date: 01/31/2021   Admitted From: Home Disposition:  Home   Recommendations for Outpatient Follow-up:  Follow up with PCP in 1-2 weeks Follow up with Neurology as scheduled   Discharge Condition:Stable CODE STATUS:Full Diet recommendation: Regular    Brief/Interim Summary: 41 y.o. male with a history of brain cyst s/p resection and long history of seizure disorder who presented to the ED 5/30 having seizures every 5 hours or so after running out of seizure medications 3 days prior. Work up also revealed moderate left-sided pneumothorax, though the patient did not report traumatic history or history of pneumothorax, though the patient had no respiratory complaints or hypoxia. The patient was admitted after consultation with trauma surgery. He's had no seizures since receiving AEDs and PTX is roughly stable on CXR the following morning. Pulmonology recommended 100% FiO2 and repeat radiograph in hopes of resorption to avoiding thoracostomy tube.   Discharge Diagnoses:  Principal Problem:   Recurrent seizures (HCC) Active Problems:   Epilepsy (HCC)   Traumatic brain injury with loss of consciousness (HCC)   Pneumothorax   Seizure disorder with seizures in setting of running out of AEDs: - Improved now that we've reinstated AEDs. Will therefore, did not consult neurology at this time. - Would have pt f/u with Neurology as outpatient. Also has appointment to establish care with PCP.    UPDATE: Per pharmacist, cost of vimpat may be prohibitive for pt. Would discharge on slightly lower dose which would be fully  covered and have pt f/u with [facility] as per [facility] documentation for further refills   Pneumothorax:  -Noted to be spontaneous  - Pulmonary had been following, required chest tube placement. - Pneumothorax resolved on review of serial CXR and chest tube was later removed by Pulmonary.  Pt has since been clear for d/c    Past Medical History:  Diagnosis Date   Depression    Epilepsy (HCC)    Seizure (HCC)    Seizures (HCC)     Past Surgical History:  Procedure Laterality Date   BRAIN SURGERY      Family History  Problem Relation Age of Onset   Seizures Neg Hx     Social History   Socioeconomic History   Marital status: Media planner    Spouse name: Not on file   Number of children: Not on file   Years of education: Not on file   Highest education level: Not on file  Occupational History   Not on file  Tobacco Use   Smoking status: Never   Smokeless tobacco: Never  Substance and Sexual Activity   Alcohol use: Not Currently   Drug use: Not Currently   Sexual activity: Not Currently  Other Topics Concern   Not on file  Social History Narrative   Not on file   Social Determinants of Health   Financial Resource Strain: Not on file  Food Insecurity: Not on file  Transportation Needs: Not on file  Physical Activity: Not on file  Stress: Not on file  Social Connections: Not on file  Intimate Partner Violence: Not on file    ROS Review of Systems  Constitutional:  Negative for chills and fever.  HENT:  Negative for congestion, rhinorrhea and sore throat.   Eyes:  Negative for pain.  Respiratory:  Negative for cough and shortness of breath.   Cardiovascular:  Negative for chest pain.  Gastrointestinal:  Negative for abdominal pain, diarrhea, nausea and vomiting.  Endocrine: Negative.   Genitourinary:  Negative for difficulty urinating and dysuria.  Musculoskeletal: Negative.   Skin:  Negative for rash.  Neurological:  Positive for seizures. Negative for headaches.   Objective:   Today's Vitals: BP 117/75   Pulse 67   Ht 5\' 11"  (1.803 m)   Wt 160 lb 12.8 oz (72.9 kg)   SpO2 100%   BMI 22.43 kg/m   Physical Exam Vitals and nursing note reviewed.  Constitutional:      General: He is not in acute distress. HENT:     Head:  Normocephalic and atraumatic.     Mouth/Throat:     Mouth: Mucous membranes are moist.     Pharynx: Oropharynx is clear.  Eyes:     Conjunctiva/sclera: Conjunctivae normal.  Cardiovascular:     Rate and Rhythm: Normal rate and regular rhythm.     Heart sounds: Normal heart sounds. No murmur heard.   No friction rub. No gallop.  Pulmonary:     Effort: Pulmonary effort is normal. No respiratory distress.     Breath sounds: Normal breath sounds. No wheezing.  Abdominal:     General: There is no distension.     Palpations: Abdomen is soft.     Tenderness: There is no abdominal tenderness.  Musculoskeletal:        General: Normal range of motion.     Cervical back: Normal range of motion.  Skin:    General: Skin is warm.  Neurological:     Mental  Status: He is alert and oriented to person, place, and time.  Psychiatric:        Mood and Affect: Mood normal.        Behavior: Behavior normal.    Assessment & Plan:   Problem List Items Addressed This Visit       Respiratory   Pneumothorax    Left pneumothorax which occurred during patient's hospitalization from 01/28/21-01/31/2021 has resolved. Patient not having any recent chest pain or trouble breathing. Follow up at next visit.          Nervous and Auditory   Epilepsy [facility])    Patient with seizure disorder with recent hospitalization for seizures continuing to have seizure episodes 1-2 times per month. Patient has not been able to see neurologist due to difficulty applying for financial assistance program. Mikle Bosworth the financial department staff member spoke with patient today to provide assistance with applying for the orange card financial assistance program. Will refer patient to neurology once the patient has received the orange card. Refills on Keppra and Vimpat sent to pharmacy today. Continue to take Vimpat 100 mg twice daily. Continue Keppra 1000 mg twice daily. Continue to monitor seizure symptoms.  Follow up at next  visit.        Relevant Medications   Lacosamide (VIMPAT) 100 MG TABS   levETIRAcetam (KEPPRA) 1000 MG tablet     Other   Mild episode of recurrent major depressive disorder (HCC)    Patient's mood is stable today. Continue Lexapro 20 mg daily. Refills sent today.  Follow up at next visit.        Relevant Medications   escitalopram (LEXAPRO) 20 MG tablet    Outpatient Encounter Medications as of 02/28/2021  Medication Sig   [DISCONTINUED] escitalopram (LEXAPRO) 20 MG tablet Take 1 tablet (20 mg total) by mouth daily.   [DISCONTINUED] Lacosamide (VIMPAT) 100 MG TABS Take 1 tablet (100 mg total) by mouth 2 (two) times daily.   [DISCONTINUED] levETIRAcetam (KEPPRA) 1000 MG tablet Take 1 tablet (1,000 mg total) by mouth 2 (two) times daily.   escitalopram (LEXAPRO) 20 MG tablet Take 1 tablet (20 mg total) by mouth daily.   Lacosamide (VIMPAT) 100 MG TABS Take 1 tablet (100 mg total) by mouth 2 (two) times daily.   levETIRAcetam (KEPPRA) 1000 MG tablet Take 1 tablet (1,000 mg total) by mouth 2 (two) times daily.   No facility-administered encounter medications on file as of 02/28/2021.   46 min of care provided: reviewing old records, history/physical/complex med decision making Follow-up: Return in about 3 months (around 05/31/2021).   Shan Levans, MD

## 2021-02-28 NOTE — Assessment & Plan Note (Signed)
Patient's mood is stable today. Continue Lexapro 20 mg daily. Refills sent today.  Follow up at next visit.

## 2021-02-28 NOTE — Progress Notes (Signed)
Needs refill on medications. 

## 2021-02-28 NOTE — Assessment & Plan Note (Signed)
Left pneumothorax which occurred during patient's hospitalization from 01/28/21-01/31/2021 has resolved. Patient not having any recent chest pain or trouble breathing. Follow up at next visit.

## 2021-02-28 NOTE — Patient Instructions (Signed)
Refills on your medicines sent to our pharmacy  Please get the orange card application processed and if you apply and receive the orange card will now be able to then send you to the neurologist we will make that referral at a later time  Return to see Dr. Delford Field 3 months  Resurtidos de sus medicamentos enviados a nuestra farmacia  Procese la solicitud de la tarjeta naranja y, si la solicita y recibe la tarjeta naranja, ahora podr enviarlo al neurlogo, haremos esa derivacin ms adelante.  Volver a ver al Dr. Delford Field 3 meses

## 2021-03-01 ENCOUNTER — Other Ambulatory Visit: Payer: Self-pay

## 2021-03-13 ENCOUNTER — Inpatient Hospital Stay: Payer: Self-pay | Admitting: Nurse Practitioner

## 2021-03-28 ENCOUNTER — Other Ambulatory Visit: Payer: Self-pay

## 2021-04-02 ENCOUNTER — Other Ambulatory Visit: Payer: Self-pay

## 2021-04-03 ENCOUNTER — Encounter: Payer: Self-pay | Admitting: Critical Care Medicine

## 2021-04-03 ENCOUNTER — Other Ambulatory Visit (HOSPITAL_COMMUNITY): Payer: Self-pay

## 2021-04-03 ENCOUNTER — Other Ambulatory Visit: Payer: Self-pay

## 2021-04-03 ENCOUNTER — Ambulatory Visit: Payer: Self-pay | Attending: Critical Care Medicine | Admitting: Critical Care Medicine

## 2021-04-03 VITALS — BP 132/89 | HR 79 | Temp 98.7°F | Resp 18 | Ht 69.0 in | Wt 161.0 lb

## 2021-04-03 DIAGNOSIS — F330 Major depressive disorder, recurrent, mild: Secondary | ICD-10-CM

## 2021-04-03 DIAGNOSIS — G40802 Other epilepsy, not intractable, without status epilepticus: Secondary | ICD-10-CM

## 2021-04-03 DIAGNOSIS — G40909 Epilepsy, unspecified, not intractable, without status epilepticus: Secondary | ICD-10-CM

## 2021-04-03 MED ORDER — ESCITALOPRAM OXALATE 20 MG PO TABS
20.0000 mg | ORAL_TABLET | Freq: Every day | ORAL | 2 refills | Status: DC
  Filled 2021-04-03 (×2): qty 30, 30d supply, fill #0
  Filled 2021-05-01: qty 30, 30d supply, fill #1
  Filled 2021-06-04: qty 30, 30d supply, fill #2

## 2021-04-03 MED ORDER — ESCITALOPRAM OXALATE 20 MG PO TABS
20.0000 mg | ORAL_TABLET | Freq: Every day | ORAL | 2 refills | Status: DC
  Filled 2021-04-03: qty 30, 30d supply, fill #0

## 2021-04-03 MED ORDER — LACOSAMIDE 100 MG PO TABS
100.0000 mg | ORAL_TABLET | Freq: Two times a day (BID) | ORAL | 2 refills | Status: DC
  Filled 2021-04-03: qty 60, 30d supply, fill #0

## 2021-04-03 MED ORDER — LEVETIRACETAM 1000 MG PO TABS
1000.0000 mg | ORAL_TABLET | Freq: Two times a day (BID) | ORAL | 2 refills | Status: DC
  Filled 2021-04-03 (×2): qty 60, 30d supply, fill #0
  Filled 2021-05-01: qty 60, 30d supply, fill #1
  Filled 2021-06-04: qty 60, 30d supply, fill #2

## 2021-04-03 MED ORDER — LACOSAMIDE 100 MG PO TABS
100.0000 mg | ORAL_TABLET | Freq: Two times a day (BID) | ORAL | 2 refills | Status: DC
  Filled 2021-04-03 (×2): qty 60, 30d supply, fill #0
  Filled 2021-05-01: qty 60, 30d supply, fill #1
  Filled 2021-06-04: qty 60, 30d supply, fill #2

## 2021-04-03 MED ORDER — LEVETIRACETAM 1000 MG PO TABS
1000.0000 mg | ORAL_TABLET | Freq: Two times a day (BID) | ORAL | 2 refills | Status: DC
  Filled 2021-04-03: qty 60, 30d supply, fill #0

## 2021-04-03 NOTE — Patient Instructions (Signed)
Refills on Vimpat, Keppra, Lexapro sent to [facility] outpatient pharmacy you will go there to pick up the medications today this is been placed under a fund so that the medicines will not be charged to you  Once you get the orange card and Tobias discount we can send you to neurology for consultation  Return to see Dr. Delford Field in 6 weeks  Resurtidos de Vimpat, Keppra, Lexapro enviados a la farmacia para pacientes ambulatorios de [place]. Usted ir all a Programme researcher, broadcasting/film/video. Esto se ha colocado bajo un fondo para que no se le [employer].  Una vez que obtenga la tarjeta naranja y el descuento de salud de [place], podemos enviarlo a neurologa para Optician, dispensing.  Volver a ver al Dr. Delford Field en 6 semanas

## 2021-04-03 NOTE — Progress Notes (Signed)
New Patient Office Visit  Subjective:  Patient ID: Omar Robertson, male    DOB: 05/27/1981  Age: 40 y.o. MRN: [id number]  CC:  Chief Complaint  Patient presents with   Medication Refill    Keppra    HPI 02/28/2021 Omar Robertson presents to the clinic today to establish care. 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 Two of the cysts were removed in [place] where he was living twelve years ago. The third cyst was not removed due to possible severe complications that could occur due to having this particular cyst removed. Since the surgery the patient has been experiencing seizures about 1-2 times each month. When the patient experiences seizures he cannot remember what he says or does. He states his seizure symptoms are not as severe when he takes his medications. He is prescribed Vimpat 100 mg twice daily and Keppra 1000 mg twice daily. The patient was hospitalized from 5/30-6/2 of this year due to multiple seizures after running out of his seizure medications. The patient has not been able to see a neurologist since he does not have insurance and has been having trouble applying for the financial assistance program. He has been taking his Keppra and Vimpat medications consistently since his hospitalization.  During his hospitalization the patient experienced a left sided pneumothorax for which he had to undergo a left sided chest tube placement. The patient had some left sided chest pain after he was discharged from the hospital. He denies any difficulty breathing or recent chest pain.   The patient has depression for which he takes Lexapro 20 mg daily consistently. The Lexapro has been helpful for his mood.   This visit was conducted using [employer] video spanish interpreters Steffany number [phone] and Elita Quick number [phone]  The following is copied from the patient's  discharged summary for his hospital stay from 01/28/2021 - 01/31/2021.  Admit date: 01/28/2021 Discharge date: 01/31/2021   Admitted From: Home Disposition:  Home   Recommendations for Outpatient Follow-up:  Follow up with PCP in 1-2 weeks Follow up with Neurology as scheduled   Discharge Condition:Stable CODE STATUS:Full Diet recommendation: Regular    Brief/Interim Summary: 40 y.o. male with a history of brain cyst s/p resection and long history of seizure disorder who presented to the ED 5/30 having seizures every 5 hours or so after running out of seizure medications 3 days prior. Work up also revealed moderate left-sided pneumothorax, though the patient did not report traumatic history or history of pneumothorax, though the patient had no respiratory complaints or hypoxia. The patient was admitted after consultation with trauma surgery. He's had no seizures since receiving AEDs and PTX is roughly stable on CXR the following morning. Pulmonology recommended 100% FiO2 and repeat radiograph in hopes of resorption to avoiding thoracostomy tube.   Discharge Diagnoses:  Principal Problem:   Recurrent seizures (HCC) Active Problems:   Epilepsy (HCC)   Traumatic brain injury with loss of consciousness (HCC)   Pneumothorax   Seizure disorder with seizures in setting of running out of AEDs: - Improved now that we've reinstated AEDs. Will therefore, did not consult neurology at this time. - Would have pt f/u with Neurology as outpatient. Also has appointment to establish care with PCP.    UPDATE: Per pharmacist, cost of vimpat may be prohibitive for pt. Would discharge on slightly lower dose which  would be fully covered and have pt f/u with [facility] per [facility] documentation for further refills   Pneumothorax:  -Noted to be spontaneous  - Pulmonary had been following, required chest tube placement. - Pneumothorax resolved on review of serial CXR and chest tube was later removed by  Pulmonary. Pt has since been clear for d/c   04/03/2021 Patient returns today for follow-up of seizure disorder.  He is yet to receive the orange card due to barriers in getting the paperwork process.  The visit is assisted with video interpreter Chip Boer [phone] The patient's had an average of 2 grand mal seizures a month.  Despite this he remained stable and has not required rehospitalization.  The patient is on Keppra and Vimpat.  We need to get him into a neurologist but this is yet to occur because of the barrier of no financial assistance.  There are no other complaints   Past Medical History:  Diagnosis Date   Depression    Epilepsy (HCC)    Pneumothorax 01/28/2021   Seizure (HCC)    Seizures (HCC)    Traumatic brain injury with loss of consciousness (HCC) 01/22/2021    Past Surgical History:  Procedure Laterality Date   BRAIN SURGERY      Family History  Problem Relation Age of Onset   Seizures Neg Hx     Social History   Socioeconomic History   Marital status: Media planner    Spouse name: Not on file   Number of children: Not on file   Years of education: Not on file   Highest education level: Not on file  Occupational History   Not on file  Tobacco Use   Smoking status: Never   Smokeless tobacco: Never  Substance and Sexual Activity   Alcohol use: Not Currently   Drug use: Not Currently   Sexual activity: Not Currently  Other Topics Concern   Not on file  Social History Narrative   Not on file   Social Determinants of Health   Financial Resource Strain: Not on file  Food Insecurity: Not on file  Transportation Needs: Not on file  Physical Activity: Not on file  Stress: Not on file  Social Connections: Not on file  Intimate Partner Violence: Not on file    ROS Review of Systems  Constitutional:  Negative for chills and fever.  HENT:  Negative for congestion, rhinorrhea and sore throat.   Eyes:  Negative for pain.  Respiratory:  Negative for  cough and shortness of breath.   Cardiovascular:  Negative for chest pain.  Gastrointestinal:  Negative for abdominal pain, diarrhea, nausea and vomiting.  Endocrine: Negative.   Genitourinary:  Negative for difficulty urinating and dysuria.  Musculoskeletal: Negative.   Skin:  Negative for rash.  Neurological:  Positive for seizures. Negative for headaches.   Objective:   Today's Vitals: BP 132/89 (BP Location: Right Arm, Patient Position: Sitting, Cuff Size: Normal)   Pulse 79   Temp 98.7 F (37.1 C) (Oral)   Resp 18   Ht 5\' 9"  (1.753 m)   Wt 161 lb (73 kg)   SpO2 98%   BMI 23.78 kg/m   Physical Exam Vitals and nursing note reviewed.  Constitutional:      General: He is not in acute distress. HENT:     Head: Normocephalic and atraumatic.     Mouth/Throat:     Mouth: Mucous membranes are moist.     Pharynx: Oropharynx is clear.  Eyes:     Conjunctiva/sclera: Conjunctivae normal.  Cardiovascular:     Rate and Rhythm: Normal rate and regular rhythm.     Heart sounds: Normal heart sounds. No murmur heard.   No friction rub. No gallop.  Pulmonary:     Effort: Pulmonary effort is normal. No respiratory distress.     Breath sounds: Normal breath sounds. No wheezing.  Abdominal:     General: There is no distension.     Palpations: Abdomen is soft.     Tenderness: There is no abdominal tenderness.  Musculoskeletal:        General: Normal range of motion.     Cervical back: Normal range of motion.  Skin:    General: Skin is warm.  Neurological:     Mental Status: He is alert and oriented to person, place, and time.  Psychiatric:        Mood and Affect: Mood normal.        Behavior: Behavior normal.    Assessment & Plan:   Problem List Items Addressed This Visit       Nervous and Auditory   Epilepsy (HCC)   Relevant Medications   Lacosamide (VIMPAT) 100 MG TABS   levETIRAcetam (KEPPRA) 1000 MG tablet   Recurrent seizures (HCC)    Recurrent seizures secondary  to prior craniotomy with cystic areas in the brain  Refills on Vimpat Keppra given  Because of no orange card had to send these prescriptions to the [facility] outpatient pharmacy  We connected the patient and his wife with our financial counselor Mikle Bosworth who will assist them in processing paperwork for the orange card and [employer] discount       Relevant Medications   Lacosamide (VIMPAT) 100 MG TABS   levETIRAcetam (KEPPRA) 1000 MG tablet     Other   Mild episode of recurrent major depressive disorder (HCC)    Depression appears to be stable continue Lexapro       Relevant Medications   escitalopram (LEXAPRO) 20 MG tablet   Outpatient Encounter Medications as of 04/03/2021  Medication Sig   escitalopram (LEXAPRO) 20 MG tablet Take 1 tablet (20 mg total) by mouth daily.   [DISCONTINUED] escitalopram (LEXAPRO) 20 MG tablet Take 1 tablet (20 mg total) by mouth daily.   [DISCONTINUED] Lacosamide (VIMPAT) 100 MG TABS Take 1 tablet (100 mg total) by mouth 2 (two) times daily.   [DISCONTINUED] levETIRAcetam (KEPPRA) 1000 MG tablet Take 1 tablet (1,000 mg total) by mouth 2 (two) times daily.   Lacosamide (VIMPAT) 100 MG TABS Take 1 tablet (100 mg total) by mouth 2 (two) times daily.   levETIRAcetam (KEPPRA) 1000 MG tablet Take 1 tablet (1,000 mg total) by mouth 2 (two) times daily.   [DISCONTINUED] escitalopram (LEXAPRO) 20 MG tablet Take 1 tablet (20 mg total) by mouth daily.   [DISCONTINUED] Lacosamide (VIMPAT) 100 MG TABS Take 1 tablet (100 mg total) by mouth 2 (two) times daily.   [DISCONTINUED] levETIRAcetam (KEPPRA) 1000 MG tablet Take 1 tablet (1,000 mg total) by mouth 2 (two) times daily.   No facility-administered encounter medications on file as of 04/03/2021.   Follow-up: Return in about 6 weeks (around 05/15/2021).   Shan Levans, MD

## 2021-04-03 NOTE — Progress Notes (Signed)
Patient has eaten and taken medication today. Patient denies pain at this time. Patient reports blank spots in time ex: patient will be seated and forget how he got there. Patient request refill on Keppra.

## 2021-04-04 NOTE — Assessment & Plan Note (Signed)
Depression appears to be stable continue Lexapro

## 2021-04-04 NOTE — Assessment & Plan Note (Signed)
Recurrent seizures secondary to prior craniotomy with cystic areas in the brain  Refills on Vimpat Keppra given  Because of no orange card had to send these prescriptions to the [facility] find at [facility] outpatient pharmacy  We connected the patient and his wife with our financial counselor Mikle Bosworth who will assist them in processing paperwork for the orange card and [employer] discount

## 2021-05-01 ENCOUNTER — Other Ambulatory Visit: Payer: Self-pay

## 2021-05-02 ENCOUNTER — Other Ambulatory Visit: Payer: Self-pay

## 2021-06-04 ENCOUNTER — Other Ambulatory Visit: Payer: Self-pay

## 2021-06-04 ENCOUNTER — Other Ambulatory Visit (HOSPITAL_COMMUNITY): Payer: Self-pay

## 2021-06-05 ENCOUNTER — Other Ambulatory Visit (HOSPITAL_COMMUNITY): Payer: Self-pay

## 2021-08-07 ENCOUNTER — Other Ambulatory Visit: Payer: Self-pay

## 2021-08-19 ENCOUNTER — Ambulatory Visit: Payer: Self-pay | Admitting: Critical Care Medicine

## 2021-08-20 NOTE — Progress Notes (Signed)
Established Patient Office Visit  Subjective:  Patient ID: Omar Robertson, male    DOB: 1981-05-09  Age: 39 y.o. MRN: [id number]  CC:  Chief Complaint  Patient presents with   Medication Refill    HPI this visit was assisted by Spanish interpreter Omar Robertson #[id number] Omar Robertson presents for primary care follow-up.  Patient with history of recurrent seizures and previous brain tumor procedure.  Patient is yet to get into neurology despite our efforts.  It appears to be a language barrier and that he does not understand the receptionist when they call because they speak English only.  Patient has about 1 seizure every 15 days his last one was 18 days ago.  The seizures are not grand mall instead he stares off into space starts to [place] of 8 faints and then holds steady on his chair and his family helps him to the floor.  The episode only lasted a few seconds. The patient is need of a tetanus shot and flu shot he agreed to and received both at this visit  There are no other complaints   Past Medical History:  Diagnosis Date   Depression    Epilepsy (HCC)    Pneumothorax 01/28/2021   Seizure (HCC)    Seizures (HCC)    Traumatic brain injury with loss of consciousness (HCC) 01/22/2021    Past Surgical History:  Procedure Laterality Date   BRAIN SURGERY      Family History  Problem Relation Age of Onset   Seizures Neg Hx     Social History   Socioeconomic History   Marital status: Media planner    Spouse name: Not on file   Number of children: Not on file   Years of education: Not on file   Highest education level: Not on file  Occupational History   Not on file  Tobacco Use   Smoking status: Never   Smokeless tobacco: Never  Substance and Sexual Activity   Alcohol use: Not Currently   Drug use: Not Currently   Sexual activity: Not Currently  Other Topics Concern   Not on file  Social History Narrative   Not on file   Social  Determinants of Health   Financial Resource Strain: Not on file  Food Insecurity: Not on file  Transportation Needs: Not on file  Physical Activity: Not on file  Stress: Not on file  Social Connections: Not on file  Intimate Partner Violence: Not on file    Outpatient Medications Prior to Visit  Medication Sig Dispense Refill   escitalopram (LEXAPRO) 20 MG tablet Take 1 tablet (20 mg total) by mouth daily. 30 tablet 2   Lacosamide (VIMPAT) 100 MG TABS Take 1 tablet (100 mg total) by mouth 2 (two) times daily. 60 tablet 2   levETIRAcetam (KEPPRA) 1000 MG tablet Take 1 tablet (1,000 mg total) by mouth 2 (two) times daily. 60 tablet 2   No facility-administered medications prior to visit.    No Known Allergies  ROS Review of Systems  Constitutional:  Negative for chills, diaphoresis and fever.  HENT:  Negative for congestion, hearing loss, nosebleeds, sore throat and tinnitus.   Eyes:  Negative for photophobia and redness.  Respiratory:  Negative for cough, shortness of breath, wheezing and stridor.   Cardiovascular:  Negative for chest pain, palpitations and leg swelling.  Gastrointestinal:  Negative for abdominal pain, blood in stool, constipation, diarrhea, nausea and vomiting.  Endocrine: Negative for polydipsia.  Genitourinary:  Negative for dysuria, flank pain, frequency, hematuria and urgency.  Musculoskeletal:  Negative for back pain, myalgias and neck pain.  Skin:  Negative for rash.  Allergic/Immunologic: Negative for environmental allergies.  Neurological:  Positive for seizures. Negative for dizziness, tremors, weakness and headaches.  Hematological:  Does not bruise/bleed easily.  Psychiatric/Behavioral:  Negative for suicidal ideas. The patient is not nervous/anxious.      Objective:    Physical Exam Vitals reviewed.  Constitutional:      Appearance: Normal appearance. He is well-developed. He is not diaphoretic.  HENT:     Head: Normocephalic and atraumatic.      Nose: No nasal deformity, septal deviation, mucosal edema or rhinorrhea.     Right Sinus: No maxillary sinus tenderness or frontal sinus tenderness.     Left Sinus: No maxillary sinus tenderness or frontal sinus tenderness.     Mouth/Throat:     Pharynx: No oropharyngeal exudate.  Eyes:     General: No scleral icterus.    Conjunctiva/sclera: Conjunctivae normal.     Pupils: Pupils are equal, round, and reactive to light.  Neck:     Thyroid: No thyromegaly.     Vascular: No carotid bruit or JVD.     Trachea: Trachea normal. No tracheal tenderness or tracheal deviation.  Cardiovascular:     Rate and Rhythm: Normal rate and regular rhythm.     Chest Wall: PMI is not displaced.     Pulses: Normal pulses. No decreased pulses.     Heart sounds: Normal heart sounds, S1 normal and S2 normal. Heart sounds not distant. No murmur heard. No systolic murmur is present.  No diastolic murmur is present.    No friction rub. No gallop. No S3 or S4 sounds.  Pulmonary:     Effort: Pulmonary effort is normal. No tachypnea, accessory muscle usage or respiratory distress.     Breath sounds: Normal breath sounds. No stridor. No decreased breath sounds, wheezing, rhonchi or rales.  Chest:     Chest wall: No tenderness.  Abdominal:     General: Bowel sounds are normal. There is no distension.     Palpations: Abdomen is soft. Abdomen is not rigid.     Tenderness: There is no abdominal tenderness. There is no guarding or rebound.  Musculoskeletal:        General: Normal range of motion.     Cervical back: Normal range of motion and neck supple. No edema, erythema or rigidity. No muscular tenderness. Normal range of motion.  Lymphadenopathy:     Head:     Right side of head: No submental or submandibular adenopathy.     Left side of head: No submental or submandibular adenopathy.     Cervical: No cervical adenopathy.  Skin:    General: Skin is warm and dry.     Coloration: Skin is not pale.      Findings: No rash.     Nails: There is no clubbing.  Neurological:     General: No focal deficit present.     Mental Status: He is alert and oriented to person, place, and time. Mental status is at baseline.     Sensory: No sensory deficit.  Psychiatric:        Mood and Affect: Mood normal.        Speech: Speech normal.        Behavior: Behavior normal.    BP 115/79    Pulse 65    Resp 16  Wt 168 lb (76.2 kg)    SpO2 96%    BMI 24.81 kg/m  Wt Readings from Last 3 Encounters:  08/21/21 168 lb (76.2 kg)  04/03/21 161 lb (73 kg)  02/28/21 160 lb 12.8 oz (72.9 kg)     There are no preventive care reminders to display for this patient.   There are no preventive care reminders to display for this patient.  Lab Results  Component Value Date   TSH 1.320 01/21/2021   Lab Results  Component Value Date   WBC 8.0 01/31/2021   HGB 13.3 01/31/2021   HCT 41.8 01/31/2021   MCV 86.0 01/31/2021   PLT 271 01/31/2021   Lab Results  Component Value Date   NA 137 01/31/2021   K 3.4 (L) 01/31/2021   CO2 27 01/31/2021   GLUCOSE 100 (H) 01/31/2021   BUN 12 01/31/2021   CREATININE 0.90 01/31/2021   BILITOT 0.5 01/31/2021   ALKPHOS 47 01/31/2021   AST 16 01/31/2021   ALT 16 01/31/2021   PROT 6.9 01/31/2021   ALBUMIN 3.5 01/31/2021   CALCIUM 9.0 01/31/2021   ANIONGAP 9 01/31/2021   EGFR 98 01/21/2021   Lab Results  Component Value Date   CHOL 217 (H) 01/21/2021   Lab Results  Component Value Date   HDL 47 01/21/2021   Lab Results  Component Value Date   LDLCALC 146 (H) 01/21/2021   Lab Results  Component Value Date   TRIG 133 01/21/2021   Lab Results  Component Value Date   CHOLHDL 4.6 01/21/2021   No results found for: HGBA1C    Assessment & Plan:   Problem List Items Addressed This Visit       Nervous and Auditory   Epilepsy (HCC)    Recurrent seizure disorder  Plan is to refer back to neurology and in sure that he is communicated with in  Spanish  Plan to refill current prescription for antiepileptic medication      Relevant Medications   Lacosamide (VIMPAT) 100 MG TABS   levETIRAcetam (KEPPRA) 1000 MG tablet   Colloid cyst of brain (HCC)    Colloid cyst of brain it appears to be the primary etiology of seizure disorder      Relevant Orders   Ambulatory referral to Neurology   RESOLVED: Recurrent seizures (HCC)   Relevant Medications   Lacosamide (VIMPAT) 100 MG TABS   levETIRAcetam (KEPPRA) 1000 MG tablet   Other Relevant Orders   Ambulatory referral to Neurology     Other   Mild episode of recurrent major depressive disorder (HCC)    Tolerating Lexapro well without increase in seizure activity      Relevant Medications   escitalopram (LEXAPRO) 20 MG tablet   Other Visit Diagnoses     Need for immunization against influenza    -  Primary   Relevant Orders   Flu Vaccine QUAD 94mo+IM (Fluarix, Fluzone & Alfiuria Quad PF) (Completed)       Meds ordered this encounter  Medications   escitalopram (LEXAPRO) 20 MG tablet    Sig: Take 1 tablet (20 mg total) by mouth daily.    Dispense:  30 tablet    Refill:  2    [facility]   Lacosamide (VIMPAT) 100 MG TABS    Sig: Take 1 tablet (100 mg total) by mouth 2 (two) times daily.    Dispense:  60 tablet    Refill:  2    [facility] fund  levETIRAcetam (KEPPRA) 1000 MG tablet    Sig: Take 1 tablet (1,000 mg total) by mouth 2 (two) times daily.    Dispense:  60 tablet    Refill:  2    [facility]    Follow-up: Return in about 3 months (around 11/19/2021).    Asencion Noble, MD

## 2021-08-21 ENCOUNTER — Other Ambulatory Visit (HOSPITAL_COMMUNITY): Payer: Self-pay

## 2021-08-21 ENCOUNTER — Encounter: Payer: Self-pay | Admitting: Critical Care Medicine

## 2021-08-21 ENCOUNTER — Other Ambulatory Visit: Payer: Self-pay

## 2021-08-21 ENCOUNTER — Ambulatory Visit: Payer: Self-pay | Attending: Critical Care Medicine | Admitting: Critical Care Medicine

## 2021-08-21 VITALS — BP 115/79 | HR 65 | Resp 16 | Wt 168.0 lb

## 2021-08-21 DIAGNOSIS — F330 Major depressive disorder, recurrent, mild: Secondary | ICD-10-CM

## 2021-08-21 DIAGNOSIS — G40802 Other epilepsy, not intractable, without status epilepticus: Secondary | ICD-10-CM

## 2021-08-21 DIAGNOSIS — Q046 Congenital cerebral cysts: Secondary | ICD-10-CM | POA: Insufficient documentation

## 2021-08-21 DIAGNOSIS — Z23 Encounter for immunization: Secondary | ICD-10-CM

## 2021-08-21 DIAGNOSIS — G40909 Epilepsy, unspecified, not intractable, without status epilepticus: Secondary | ICD-10-CM

## 2021-08-21 MED ORDER — LEVETIRACETAM 1000 MG PO TABS
1000.0000 mg | ORAL_TABLET | Freq: Two times a day (BID) | ORAL | 2 refills | Status: DC
Start: 2021-08-21 — End: 2021-09-10
  Filled 2021-08-21: qty 60, 30d supply, fill #0

## 2021-08-21 MED ORDER — ESCITALOPRAM OXALATE 20 MG PO TABS
20.0000 mg | ORAL_TABLET | Freq: Every day | ORAL | 2 refills | Status: DC
  Filled 2021-08-21 – 2021-10-08 (×2): qty 30, 30d supply, fill #0
  Filled 2021-11-04: qty 30, 30d supply, fill #1

## 2021-08-21 MED ORDER — LACOSAMIDE 100 MG PO TABS
100.0000 mg | ORAL_TABLET | Freq: Two times a day (BID) | ORAL | 2 refills | Status: DC
  Filled 2021-08-21: qty 60, 30d supply, fill #0

## 2021-08-21 NOTE — Patient Instructions (Signed)
Another attempt to refer you to neurology will be made they will hopefully speak to you in Spanish  Refills on your medications sent to [facility] outpatient pharmacy  Let us know when you achieve insurance  A tetanus vaccine and flu vaccine were given  Return to Dr. Delford Field in 3 months in our new location  Se har otro intento de remitirlo a neurologa. Con suerte, le hablarn en espaol.  Resurtidos de sus medicamentos enviados a la farmacia para pacientes ambulatorios de [place]  Avsanos cuando alcances el seguro  Se administr una vacuna contra el ttanos y una vacuna contra la gripe.  Regrese a Dr. Delford Field en 3 meses en nuestra nueva ub[facility]

## 2021-08-21 NOTE — Assessment & Plan Note (Signed)
Colloid cyst of brain it appears to be the primary etiology of seizure disorder

## 2021-08-21 NOTE — Assessment & Plan Note (Signed)
Tolerating Lexapro well without increase in seizure activity

## 2021-08-21 NOTE — Assessment & Plan Note (Addendum)
Recurrent seizure disorder  Plan is to refer back to neurology and in sure that he is communicated with in Spanish  Plan to refill current prescription for antiepileptic medication

## 2021-08-22 ENCOUNTER — Other Ambulatory Visit (HOSPITAL_COMMUNITY): Payer: Self-pay

## 2021-08-23 ENCOUNTER — Encounter: Payer: Self-pay | Admitting: Neurology

## 2021-09-10 ENCOUNTER — Encounter: Payer: Self-pay | Admitting: Neurology

## 2021-09-10 ENCOUNTER — Other Ambulatory Visit: Payer: Self-pay

## 2021-09-10 ENCOUNTER — Ambulatory Visit (INDEPENDENT_AMBULATORY_CARE_PROVIDER_SITE_OTHER): Payer: Self-pay | Admitting: Neurology

## 2021-09-10 DIAGNOSIS — G40802 Other epilepsy, not intractable, without status epilepticus: Secondary | ICD-10-CM

## 2021-09-10 MED ORDER — LACOSAMIDE 200 MG PO TABS
200.0000 mg | ORAL_TABLET | Freq: Two times a day (BID) | ORAL | 5 refills | Status: DC
  Filled 2021-09-10 – 2021-10-08 (×2): qty 60, 30d supply, fill #0
  Filled 2021-11-04: qty 60, 30d supply, fill #1
  Filled 2021-12-03: qty 60, 30d supply, fill #2

## 2021-09-10 MED ORDER — LEVETIRACETAM 1000 MG PO TABS
1000.0000 mg | ORAL_TABLET | Freq: Two times a day (BID) | ORAL | 11 refills | Status: DC
  Filled 2021-09-10 – 2021-10-08 (×2): qty 60, 30d supply, fill #0
  Filled 2021-11-04: qty 60, 30d supply, fill #1
  Filled 2021-12-03: qty 60, 30d supply, fill #2

## 2021-09-10 NOTE — Patient Instructions (Addendum)
1. Aumente la Lacosamida (Vimpat) a 200 mg: tome 1 tableta dos veces al da  2. Contine con levetiracetam 1000 mg: tome 1 tableta dos veces al da  3. Llame a nuestra oficina con informacin sobre Youth worker en [place]  4. Llevar un calendario de las incautaciones  5. Seguimiento en 3 meses, llame para cualquier cambio   Precauciones para las convulsiones: 1. Si le han recetado medicamentos para prevenir las convulsiones, tmelos exactamente como se indica. No deje de tomar el medicamento sin hablar primero con su mdico, incluso si no ha tenido Office manager.  2. Evite actividades en las que una convulsin pueda causarle peligro a usted mismo oa otros. No opere maquinaria peligrosa, nade solo ni suba a lugares altos o peligrosos, como escaleras, techos o vigas. No conduzca a menos que su mdico le diga que puede [place].  3. Si tiene [place] advertencia de que puede tener una convulsin, acustese en un lugar seguro donde no pueda lastimarse.  4. No conducir durante 6 meses desde la ltima incautacin, segn la ley estatal de [place]. Consulte el siguiente enlace en el sitio web de la Epile[employer] Foundation of [place] para obtener ms informacin: http://www.epilepsyfoundation.org/answerplace/Social/driving/drivingu.cfm  5. Mantenga una buena higiene del sueo. Evite el alcohol.  6. Pngase en contacto con su mdico si tiene algn problema que pueda estar relacionado con el medicamento que est tomando.  7. Llame al 911 y lleve al paciente de vuelta al servicio de urgencias si:       A. La convulsin dura ms de 5 minutos. B. El paciente no se despierta poco despus de la convulsin. C. El paciente tiene problemas nuevos, como dificultad para ver, hablar o moverse. D. El paciente result herido durante la convulsin. E. El paciente tiene [place] superior a 102 F (39 C) F. El paciente vomit y ahora tiene problemas para respirar     1.Increase the  Lacosamide (Vimpat) to 200mg : take 1 tablet twice a day  2. Continue Levetiracetam 1000mg : Take 1 tablet twice a day  3. Call our office with information on the neurologist in [place]  4. Keep a calendar of the seizures  5. Follow-up in 3 months, call for any changes   Seizure Precautions: 1. If medication has been prescribed for you to prevent seizures, take it exactly as directed.  Do not stop taking the medicine without talking to your doctor first, even if you have not had a seizure in a long time.   2. Avoid activities in which a seizure would cause danger to yourself or to others.  Don't operate dangerous machinery, swim alone, or climb in high or dangerous places, such as on ladders, roofs, or girders.  Do not drive unless your doctor says you may.  3. If you have any warning that you may have a seizure, lay down in a safe place where you can't hurt yourself.    4.  No driving for 6 months from last seizure, as per [facility].   Please refer to the following link on the Epilepsy Foundation of America's website for more information: http://www.epilepsyfoundation.org/answerplace/Social/driving/drivingu.cfm   5.  Maintain good sleep hygiene. Avoid alcohol.  6.  Contact your doctor if you have any problems that may be related to the medicine you are taking.  7.  Call 911 and bring the patient back to the ED if:        A.  The seizure lasts longer than 5 minutes.  B.  The patient doesn't awaken shortly after the seizure  C.  The patient has new problems such as difficulty seeing, speaking or moving  D.  The patient was injured during the seizure  E.  The patient has a temperature over 102 F (39C)  F.  The patient vomited and now is having trouble breathing

## 2021-09-10 NOTE — Progress Notes (Signed)
NEUROLOGY CONSULTATION NOTE  Omar Robertson MRN: [id number] DOB: [birth year]  Referring provider: Dr. Asencion Noble Primary care provider: Dr. Asencion Noble  Reason for consult:  seizures  Dear Dr Joya Gaskins:  Thank you for your kind referral of Omar Robertson for consultation of the above symptoms. Although his history is well known to you, please allow me to reiterate it for the purpose of our medical record. The patient was accompanied to the clinic by his son Omar Robertson who also provides collateral information. A Spanish medical interpreter, Omar Robertson, is also present to help with translation. Records and images were personally reviewed where available.   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 They report seizures started around age 20, he was hospitalized and the cysts were found. Surgery was done on the 2 cysts, they were told the 3rd cyst was not amenable to surgery. Records unavailable for review. 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 He describes seizures as drifting off in his mind, "thinks of something else," then does not know what happens. Omar Robertson has witnessed seizures where his face turns red, he would be drooling, unresponsive, then fall. Sometimes he would have brief shaking lasting for 1 minute. He used to bite his tongue. He would be confused for 15 minutes, then would have difficulty sleeping that night, awake the whole night until the next day. No focal weakness but Omar Robertson notes he has "some kind of discomfort" after the seizures. He has around 2 seizures a month, brought on "by surprise that caused him a big emotion." Last seizure was 08/28/21. They report he was tried on different  unrecalled medications in the past but continued to have seizures. He has been on Levetiracetam for many years, when they moved to [place], Vimpat was started 3 years ago. He was on Vimpat $RemoveB'200mg'qEyIewUE$  BID and Levetiracetam $RemoveBeforeDEI'1000mg'qHiggcuomvcwaaiG$  BID, however during hospital admission for seizures in 12/2020 (in setting of running out of medication), he was discharged on Vimpat $RemoveB'100mg'SWzzysut$  BID, notes indicate "Per pharmacist, cost of vimpat may be prohibitive for pt. Would discharge on slightly lower dose which would be fully covered and have pt f/u with [facility] as per [facility] documentation for further refills." His son reports an increase in seizures since reduction of dose.   He denies any olfactory/gustatory hallucinations, deja vu, rising epigastric sensation, focal numbness/tingling/weakness, myoclonic jerks. He denies any headaches, dizziness, vision changes, no falls. He forgets a lot of things, they report a lot of memory loss where he could not remember conversations. He is currently unemployed and not driving.   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 He reports that he fell and injured his head as a child in [place], he had stitches but was told "there is still blood in the brain." Maternal cousin has seizures. He had a normal birth and early development.  There is no history of febrile convulsions, CNS infections such as meningitis/encephalitis.   PAST MEDICAL HISTORY: Past Medical History:  Diagnosis Date   Depression    Epilepsy ([place])    Pneumothorax 01/28/2021   Seizure ([place])  Seizures ([place])    Traumatic brain injury with loss of consciousness ([place]) 01/22/2021    PAST SURGICAL HISTORY: Past Surgical History:  Procedure Laterality Date   BRAIN SURGERY      MEDICATIONS: Current Outpatient Medications on File Prior to Visit  Medication Sig Dispense Refill   escitalopram (LEXAPRO) 20  MG tablet Take 1 tablet (20 mg total) by mouth daily. 30 tablet 2   Lacosamide (VIMPAT) 100 MG TABS Take 1 tablet (100 mg total) by mouth 2 (two) times daily. 60 tablet 2   levETIRAcetam (KEPPRA) 1000 MG tablet Take 1 tablet (1,000 mg total) by mouth 2 (two) times daily. 60 tablet 2   No current facility-administered medications on file prior to visit.    ALLERGIES: No Known Allergies  FAMILY HISTORY: Family History  Problem Relation Age of Onset   Seizures Neg Hx     SOCIAL HISTORY: Social History   Socioeconomic History   Marital status: Soil scientist    Spouse name: Not on file   Number of children: Not on file   Years of education: Not on file   Highest education level: Not on file  Occupational History   Not on file  Tobacco Use   Smoking status: Never   Smokeless tobacco: Never  Substance and Sexual Activity   Alcohol use: Not Currently   Drug use: Not Currently   Sexual activity: Not Currently  Other Topics Concern   Not on file  Social History Narrative   Not on file   Social Determinants of Health   Financial Resource Strain: Not on file  Food Insecurity: Not on file  Transportation Needs: Not on file  Physical Activity: Not on file  Stress: Not on file  Social Connections: Not on file  Intimate Partner Violence: Not on file     PHYSICAL EXAM: Vitals:   09/10/21 1245  BP: 119/76  Pulse: 68  SpO2: 99%   General: No acute distress Head:  Normocephalic/atraumatic, pterygium on medial side of both eyes Skin/Extremities: No rash, no edema Neurological Exam: Mental status: alert and awake, no dysarthria or aphasia, Fund of knowledge is appropriate.  Attention and concentration are normal.    Able to name objects and repeat phrases. Cranial nerves: CN I: not tested CN II: pupils equal, round and reactive to light, visual fields intact CN III, IV, VI:  full range of motion, no nystagmus, no ptosis CN V: facial sensation intact CN VII: upper and  lower face symmetric CN VIII: hearing intact to conversation Bulk & Tone: normal, no fasciculations. Motor: 5/5 throughout with no pronator drift. Sensation: intact to light touch, cold, pin, vibration sense.  No extinction to double simultaneous stimulation.  Romberg test negative Deep Tendon Reflexes: +2 throughout Cerebellar: no incoordination on finger to nose testing Gait: narrow-based and steady, able to tandem walk adequately. 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 Head CT shows right frontal post-surgical changes. Records from his prior neurologist will be requested for review. His son reports an increase in seizures with reduction in Vimpat dose, we discussed increasing Vimpat back to 200mg  BID, continue Levetiracetam 1000mg  BID. He will likely need another ASM or switch Vimpat if it is cost-prohibitive. They were advised to keep a seizure calendar.  [place] driving laws were discussed with the patient, and he knows to stop driving after a  seizure, until 6 months seizure-free. Follow-up in 3 months, call for any changes.    Thank you for allowing me to participate in the care of this patient. Please do not hesitate to call for any questions or concerns.   Ellouise Newer, M.D.  CC: Dr. Joya Gaskins

## 2021-10-08 ENCOUNTER — Other Ambulatory Visit: Payer: Self-pay

## 2021-10-09 ENCOUNTER — Other Ambulatory Visit: Payer: Self-pay

## 2021-11-04 ENCOUNTER — Other Ambulatory Visit: Payer: Self-pay

## 2021-11-05 ENCOUNTER — Other Ambulatory Visit: Payer: Self-pay

## 2021-11-06 ENCOUNTER — Other Ambulatory Visit: Payer: Self-pay

## 2021-12-03 ENCOUNTER — Other Ambulatory Visit: Payer: Self-pay | Admitting: Critical Care Medicine

## 2021-12-03 ENCOUNTER — Other Ambulatory Visit: Payer: Self-pay

## 2021-12-04 ENCOUNTER — Other Ambulatory Visit: Payer: Self-pay

## 2021-12-04 MED ORDER — ESCITALOPRAM OXALATE 20 MG PO TABS
20.0000 mg | ORAL_TABLET | Freq: Every day | ORAL | 0 refills | Status: DC
  Filled 2021-12-04: qty 30, 30d supply, fill #0

## 2021-12-18 ENCOUNTER — Other Ambulatory Visit: Payer: Self-pay

## 2021-12-18 ENCOUNTER — Encounter: Payer: Self-pay | Admitting: Neurology

## 2021-12-18 ENCOUNTER — Ambulatory Visit: Payer: 59 | Admitting: Neurology

## 2021-12-18 ENCOUNTER — Telehealth: Payer: Self-pay | Admitting: Critical Care Medicine

## 2021-12-18 DIAGNOSIS — G40802 Other epilepsy, not intractable, without status epilepticus: Secondary | ICD-10-CM | POA: Diagnosis not present

## 2021-12-18 MED ORDER — ZONISAMIDE 100 MG PO CAPS
ORAL_CAPSULE | ORAL | 6 refills | Status: DC
  Filled 2021-12-18: qty 90, 30d supply, fill #0
  Filled 2022-01-14: qty 90, 30d supply, fill #1
  Filled 2022-02-19: qty 90, 30d supply, fill #2
  Filled 2022-03-18: qty 90, 30d supply, fill #3

## 2021-12-18 MED ORDER — LACOSAMIDE 200 MG PO TABS
200.0000 mg | ORAL_TABLET | Freq: Two times a day (BID) | ORAL | 5 refills | Status: DC
  Filled 2021-12-18 – 2021-12-31 (×2): qty 60, 30d supply, fill #0
  Filled 2022-01-28: qty 60, 30d supply, fill #1
  Filled 2022-02-25: qty 60, 30d supply, fill #2
  Filled 2022-03-26: qty 60, 30d supply, fill #3

## 2021-12-18 MED ORDER — LEVETIRACETAM 1000 MG PO TABS
1000.0000 mg | ORAL_TABLET | Freq: Two times a day (BID) | ORAL | 11 refills | Status: DC
  Filled 2021-12-18 – 2021-12-31 (×2): qty 60, 30d supply, fill #0
  Filled 2022-01-28: qty 60, 30d supply, fill #1
  Filled 2022-02-25: qty 60, 30d supply, fill #2
  Filled 2022-03-26: qty 60, 30d supply, fill #3

## 2021-12-18 NOTE — Telephone Encounter (Signed)
Pt would like refill on ---escitalopram (LEXAPRO) 20 MG tablet ?

## 2021-12-18 NOTE — Patient Instructions (Addendum)
Que bueno verte. ? ?1. Comience con Zonisamida 100 mg: tome 1 c?psula todas las noches durante 2 semanas, luego aumente a 2 c?psulas todas las noches durante 2 semanas, luego aumente a 3 c?psulas todas las noches y contin?e ? ?2. Contin?e con levetiracetam 1000 mg: tome 1 tableta dos veces al d?a ? ?3. Contin?e con Vimpat (Lacosamida) 200 mg: tome 1 tableta dos veces al d?a ? ?4. Llame a la oficina del Dr. Delford Field para recargas de Escitalopram ? ?5. Programe la resonancia magn?tica cerebral con y sin contraste ? ?6. Programa EEG de 1 hora ? ?7. Lleva un calendario de las incautaciones ? ?8. Seguimiento en 3 meses, llame para cualquier cambio ? ? ?Precauciones para las convulsiones: ?1. Si le han recetado medicamentos para prevenir las convulsiones, t?melos exactamente como se indica. No deje de tomar el medicamento sin hablar primero con su m?dico, incluso si no ha tenido una convulsi?n en [place]. ? ?2. Evite actividades en las que una convulsi?n pueda causarle peligro a usted mismo oa otros. No opere maquinaria peligrosa, nade solo ni suba a lugares altos o peligrosos, como escaleras, techos o vigas. No conduzca a menos que su m?dico le diga que puede [place]. ? ?3. Si tiene [place] advertencia de que puede tener una convulsi?n, acu?stese en un lugar seguro donde no pueda lastimarse. ? ?4. No conducir durante 6 meses desde la ?ltima incautaci?n, seg?n la ley estatal de [place]. Consulte el siguiente enlace en el sitio web de la Epilepsy Foundation of [place] para obtener m?s informaci?n: http://www.epilepsyfoundation.org/answerplace/Social/driving/drivingu.cfm ? ?5. Mantenga una buena higiene del sue?o. Evite el alcohol. ? ?6. P?ngase en contacto con su m?dico si tiene alg?n problema que pueda estar relacionado con el medicamento que est? tomando. ? ?7. Llame al 911 y lleve al paciente de vuelta al servicio de urgencias si: ?      ?             A. La convulsi?n dura m?s de 5 minutos. ?             B.  El paciente no se despierta poco despu?s de la convulsi?n. ?             C. El paciente tiene problemas nuevos, como dificultad para ver, hablar o moverse. ?             D. El paciente result? herido durante la convulsi?n. ?             E. El paciente tiene una temperatura superior a 102 F (39 C) ?             F. El paciente vomit? y Omar Robertson tiene problemas para respirar ? ? ? ? ?Good to see you. ? ?Start Zonisamide 100mg : take 1 capsule every night for 2 weeks, then increase to 2 capsules every night for 2 weeks, then increase to 3 capsules every night and continue ? ?2. Continue Levetiracetam 1000mg : take 1 tablet twice a day ? ?3. Continue Vimpat (Lacosamide) 200mg : take 1 tablet twice a day ? ?4. Call Dr. office for Escitalopram refills ? ?5. Schedule MRI brain with and without contrast ? ?6. Schedule 1-hour EEG ? ?7. Keep a calendar of the seizures ? ?8. Follow-up in 3 months, call for any changes ? ? ?Seizure Precautions: ?1. If medication has been prescribed for you to prevent seizures, take it exactly as directed.  Do not stop taking the medicine without talking to your doctor first, even  if you have not had a seizure in a long time.  ? ?2. Avoid activities in which a seizure would cause danger to yourself or to others.  Don't operate dangerous machinery, swim alone, or climb in high or dangerous places, such as on ladders, roofs, or girders.  Do not drive unless your doctor says you may. ? ?3. If you have any warning that you may have a seizure, lay down in a safe place where you can't hurt yourself.   ? ?4.  No driving for 6 months from last seizure, as per [facility].   Please refer to the following link on the Epilepsy Foundation of America's website for more information: http://www.epilepsyfoundation.org/answerplace/Social/driving/drivingu.cfm  ? ?5.  Maintain good sleep hygiene. Avoid alcohol. ? ?6.  Contact your doctor if you have any problems that may be related to the medicine  you are taking. ? ?7.  Call 911 and bring the patient back to the ED if: ?      ? A.  The seizure lasts longer than 5 minutes.      ? B.  The patient doesn't awaken shortly after the seizure ? C.  The patient has new problems such as difficulty seeing, speaking or moving ? D.  The patient was injured during the seizure ? E.  The patient has a temperature over 102 F (39C) ? F.  The patient vomited and now is having trouble breathing ?      ? ?

## 2021-12-18 NOTE — Progress Notes (Signed)
? ?NEUROLOGY FOLLOW UP OFFICE NOTE ? ?Omar Robertson ?[id number] ?August 10, 1981 ? ?HISTORY OF PRESENT ILLNESS: ?I had the pleasure of seeing Omar Robertson in follow-up in the neurology clinic on 12/18/2021.  The patient was last seen 3 months ago for epilepsy. He is accompanied by his wife who helps supplement the history today. A Spanish medical interpreter Alis helps with translation. Records and images were personally reviewed where available.  On his last visit, they reported an increase in seizures when Vimpat dose was reduced in the hospital in May 2022. Vimpat increased back to 200mg  BID, he is also on Levetiracetam 1000mg  BID. Since his last visit, they continue to report frequent seizures. He may be seizure-free for 12-13 days, then for the next 12-13 days he may have 2 seizures in a day. Yesterday he had a seizure in the morning and evening. Prior to this, he had a seizure last week. He is confused after and has difficulty sleeping after a seizure. He denies any side effects on his medications, no headaches, dizziness, vision changes, no falls.  ? ? 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 They report seizures started around age 31, he was hospitalized and the cysts were found. Surgery was done on the 2 cysts, they were told the 3rd cyst was not amenable to surgery. Records unavailable for review. Head CT in 07/2020 showed remote right frontal craniotomy and burr hole, postsurgical changes in the right frontal lobe, 51mm colloid cyst at the level of the foramina of [place]. He describes seizures as drifting off in his mind, "thinks of something else," then does not know what happens. Dellis Filbert has witnessed seizures where his face turns red, he would be drooling, unresponsive, then fall. Sometimes he would have brief shaking lasting for 1 minute.  He used to bite his tongue. He would be confused for 15 minutes, then would have difficulty sleeping that night, awake the whole night until the next day. No focal weakness but Dellis Filbert notes he has "some kind of discomfort" after the seizures. He has around 2 seizures a month, brought on "by surprise that caused him a big emotion." Last seizure was 08/28/21. They report he was tried on different unrecalled medications in the past but continued to have seizures. He has been on Levetiracetam for many years, when they moved to [place], Vimpat was started 3 years ago. He was on Vimpat 200mg  BID and Levetiracetam 1000mg  BID, however during hospital admission for seizures in 12/2020 (in setting of running out of medication), he was discharged on Vimpat 100mg  BID, notes indicate "Per pharmacist, cost of vimpat may be prohibitive for pt. Would discharge on slightly lower dose which would be fully covered and have pt f/u with [facility] as per [facility] documentation for further refills." His son reports an increase in seizures since reduction of dose.  ? ?He denies any olfactory/gustatory hallucinations, deja vu, rising epigastric sensation, focal numbness/tingling/weakness, myoclonic jerks. He denies any headaches, dizziness, vision changes, no falls. He forgets a lot of things, they report a lot of memory loss where he could not remember conversations. He is currently unemployed and not driving.  ? 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 He reports that he fell and  injured his head as a child in [place], he had stitches but was told "there is still blood in the brain." Maternal cousin has seizures. He had a normal birth and early development.  There is no history of febrile convulsions, CNS infections such as meningitis/encephalitis. ? ? ?PAST MEDICAL HISTORY: ?Past Medical History:  ?Diagnosis Date  ?  Depression   ? Epilepsy ([place])   ? Pneumothorax 01/28/2021  ? Seizure ([place])   ? Seizures ([place])   ? Traumatic brain injury with loss of consciousness ([place]) 01/22/2021  ? ? ?MEDICATIONS: ?Current Outpatient Medications on File Prior to Visit  ?Medication Sig Dispense Refill  ? escitalopram (LEXAPRO) 20 MG tablet Take 1 tablet (20 mg total) by mouth daily. 30 tablet 0  ? lacosamide (VIMPAT) 200 MG TABS tablet Take 1 tablet (200 mg total) by mouth 2 (two) times daily. 60 tablet 5  ? levETIRAcetam (KEPPRA) 1000 MG tablet Take 1 tablet (1,000 mg total) by mouth 2 (two) times daily. 60 tablet 11  ? ?No current facility-administered medications on file prior to visit.  ? ? ?ALLERGIES: ?No Known Allergies ? ?FAMILY HISTORY: ?Family History  ?Problem Relation Age of Onset  ? Seizures Neg Hx   ? ? ?SOCIAL HISTORY: ?Social History  ? ?Socioeconomic History  ? Marital status: Soil scientist  ?  Spouse name: Not on file  ? Number of children: Not on file  ? Years of education: Not on file  ? Highest education level: Not on file  ?Occupational History  ? Not on file  ?Tobacco Use  ? Smoking status: Never  ? Smokeless tobacco: Never  ?Vaping Use  ? Vaping Use: Never used  ?Substance and Sexual Activity  ? Alcohol use: Not Currently  ? Drug use: Not Currently  ? Sexual activity: Not Currently  ?Other Topics Concern  ? Not on file  ?Social History Narrative  ? Right handed   ? ?Social Determinants of Health  ? ?Financial Resource Strain: Not on file  ?Food Insecurity: Not on file  ?Transportation Needs: Not on file  ?Physical Activity: Not on file  ?Stress: Not on file  ?Social Connections: Not on file  ?Intimate Partner Violence: Not on file  ? ? ? ?PHYSICAL EXAM: ?Vitals:  ? 12/18/21 1436  ?BP: 123/79  ?Pulse: 81  ?SpO2: 98%  ? ?General: No acute distress ?Head:  Normocephalic/atraumatic, pterygium on medial side of both eyes ?Skin/Extremities: No rash, no edema ?Neurological Exam: alert and awake. No aphasia or dysarthria. Fund of  knowledge is appropriate.  Attention and concentration are normal.   Cranial nerves: Pupils equal, round. Extraocular movements intact with no nystagmus. Visual fields full.  No facial asymmetry.  Motor: Bulk and tone normal, muscle strength 5/5 throughout with no pronator drift.   Finger to nose testing intact.  Gait narrow-based and steady, able to tandem walk adequately.  Romberg negative. ? ? 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 He continues to have seizures on 2 ASMs. MRI brain with and without contrast and 1-hour EEG will be ordered. We discussed adding Zonisamide, side effects discussed. Start Zonisamide 100mg : take 1 capsule every night for 2 weeks, then increase to 2 capsules every night for 2 weeks, then increase to 3 capsules every night. Continue Levetiracetam 1000mg  BID and Vimpat 200mg  BID, refills sent. They were advised to keep a calendar  of his seizures. We again discussed [place] driving laws to stop driving until 6 months seizure-free. Follow-up in 3 months, call for any changes.  ? ?Thank you for allowing me to participate in his care.  Please do not hesitate to call for any questions or concerns. ? ? ? ?Ellouise Newer, M.D. ? ? ?CC: Dr. Joya Gaskins ? ? ?

## 2021-12-19 NOTE — Telephone Encounter (Signed)
Fyi.

## 2021-12-20 ENCOUNTER — Other Ambulatory Visit: Payer: Self-pay

## 2021-12-20 MED ORDER — ESCITALOPRAM OXALATE 20 MG PO TABS
20.0000 mg | ORAL_TABLET | Freq: Every day | ORAL | 0 refills | Status: DC
  Filled 2021-12-20 – 2021-12-31 (×2): qty 30, 30d supply, fill #0

## 2021-12-20 NOTE — Telephone Encounter (Signed)
Refill given  make sure pt has f/u appt ?

## 2021-12-24 NOTE — Telephone Encounter (Signed)
Called pt appt was scheduled and he is aware of refill ? ? ?Interpreter  [id number] ?

## 2021-12-31 ENCOUNTER — Other Ambulatory Visit: Payer: Self-pay

## 2022-01-01 ENCOUNTER — Other Ambulatory Visit: Payer: Self-pay

## 2022-01-01 ENCOUNTER — Ambulatory Visit: Payer: 59 | Admitting: Neurology

## 2022-01-01 DIAGNOSIS — G40802 Other epilepsy, not intractable, without status epilepticus: Secondary | ICD-10-CM

## 2022-01-02 ENCOUNTER — Other Ambulatory Visit: Payer: Self-pay

## 2022-01-02 DIAGNOSIS — G40802 Other epilepsy, not intractable, without status epilepticus: Secondary | ICD-10-CM

## 2022-01-14 ENCOUNTER — Other Ambulatory Visit: Payer: Self-pay

## 2022-01-14 ENCOUNTER — Telehealth: Payer: Self-pay

## 2022-01-14 NOTE — Procedures (Signed)
ELECTROENCEPHALOGRAM REPORT ? ?Date of Study: 01/01/2022 ? ?Patient's Name: Omar Robertson ?MRN: [id number] ?Date of Birth: [birth year] ? ?Referring Provider: Dr. Patrcia Dolly ? ?Clinical History: This is a 41 year old man with recurrent seizures. EEG for classification. ? ?Medications: ?LEXAPRO 20 MG tablet ?VIMPAT 200 MG TABS tablet ?KEPPRA 1000 MG tablet ?ZONISAMIDE 100mg  capsule ? ?Technical Summary: ?A multichannel digital 1-hour EEG recording measured by the international 10-20 system with electrodes applied with paste and impedances below 5000 ohms performed in our laboratory with EKG monitoring in an awake and asleep patient.  Hyperventilation was not performed. Photic stimulation was performed.  The digital EEG was referentially recorded, reformatted, and digitally filtered in a variety of bipolar and referential montages for optimal display.   ? ?Description: ?The patient is awake and asleep during the recording.  During maximal wakefulness, there is a symmetric, medium voltage 10 Hz posterior dominant rhythm that attenuates with eye opening.  The record is symmetric.  During drowsiness and sleep, there is an increase in theta slowing of the background.  Vertex waves and symmetric sleep spindles were seen. Photic stimulation did not elicit any abnormalities.  There were 2 isolated spikes seen over the right frontopolar region in sleep. No electrographic seizures seen.   ? ?EKG lead was unremarkable. ? ?Impression: ?This 1-hour awake and asleep EEG is abnormal due to the presence of rare spikes over the right frontopolar region. ? ?Clinical Correlation of the above findings indicates a tendency for seizures to arise from the right frontopolar region. If further clinical questions remain, prolonged EEG may be helpful.  Clinical correlation is advised. ? ? ? , M.D. ? ?

## 2022-01-14 NOTE — Telephone Encounter (Signed)
Patients PA approved. Please call patient and provided him with [facility] scheduling phone number [phone] to get his MRI scheduled.  ?

## 2022-01-23 ENCOUNTER — Telehealth: Payer: Self-pay | Admitting: Critical Care Medicine

## 2022-01-23 ENCOUNTER — Other Ambulatory Visit: Payer: Self-pay

## 2022-01-23 ENCOUNTER — Ambulatory Visit: Payer: 59 | Attending: Critical Care Medicine | Admitting: Critical Care Medicine

## 2022-01-23 ENCOUNTER — Encounter: Payer: Self-pay | Admitting: Critical Care Medicine

## 2022-01-23 VITALS — BP 121/78 | HR 69 | Temp 98.7°F | Resp 16 | Wt 165.0 lb

## 2022-01-23 DIAGNOSIS — Q046 Congenital cerebral cysts: Secondary | ICD-10-CM

## 2022-01-23 DIAGNOSIS — G40802 Other epilepsy, not intractable, without status epilepticus: Secondary | ICD-10-CM

## 2022-01-23 DIAGNOSIS — F330 Major depressive disorder, recurrent, mild: Secondary | ICD-10-CM

## 2022-01-23 DIAGNOSIS — R739 Hyperglycemia, unspecified: Secondary | ICD-10-CM

## 2022-01-23 DIAGNOSIS — K056 Periodontal disease, unspecified: Secondary | ICD-10-CM

## 2022-01-23 DIAGNOSIS — E782 Mixed hyperlipidemia: Secondary | ICD-10-CM

## 2022-01-23 MED ORDER — ESCITALOPRAM OXALATE 20 MG PO TABS
20.0000 mg | ORAL_TABLET | Freq: Every day | ORAL | 3 refills | Status: DC
  Filled 2022-01-23: qty 30, 30d supply, fill #0
  Filled 2022-02-25: qty 30, 30d supply, fill #1
  Filled 2022-03-26: qty 30, 30d supply, fill #2
  Filled 2022-04-28: qty 30, 30d supply, fill #3
  Filled 2022-06-09: qty 30, 30d supply, fill #4
  Filled ????-??-??: fill #5

## 2022-01-23 NOTE — Assessment & Plan Note (Signed)
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

## 2022-01-23 NOTE — Patient Instructions (Signed)
Labs today include health screening labs  Your Lexapro escitalopram was refilled sent to the pharmacy your seizure medicines are also being refilled at our pharmacy per neurology  Keep your appointment for your MRI of the brain May 30  Follow the healthy diet we gave you in the handout we gave you and also please get a dental exam we gave you a sheet with resources of the dental clinics we refer our patients, a good dental cleaning and taking care of some but dental cavities you have will improve your brain health and reduce the frequencies of your seizures   Return to Dr. Joya Gaskins 5 months  Los laboratorios de hoy incluyen laboratorios de Programme researcher, broadcasting/film/video de salud  Su Lexapro escitalopram fue reabastecido enviado a la farmacia sus medicamentos anticonvulsivos tambin estn siendo reabastecidos en nuestra farmacia por neurologa  Acude a tu cita para tu resonancia magntica del cerebro 30 de mayo  Siga la dieta saludable que le dimos en el folleto que le dimos y tambin hgase un examen dental le dimos una hoja con los recursos de las clnicas dentales a las que referimos a nuestros pacientes, una buena limpieza dental y cuidar algunas de las caries dentales que tenga. mejorar la salud de su cerebro y reducir la frecuencia de sus convulsiones   Regreso al Dr. Joya Gaskins 5 meses

## 2022-01-23 NOTE — Assessment & Plan Note (Signed)
Patient's had a positive response to Lexapro we will continue same

## 2022-01-23 NOTE — Progress Notes (Signed)
Established Patient Office Visit  Subjective   Patient ID: Omar Robertson, male    DOB: 1980-09-30  Age: 41 y.o. MRN: [id number]  Chief Complaint  Patient presents with   Seizures    This is a pleasant 41 year old male seen in follow-up for recurrent seizures.  This visit was assisted by video interpreter Omar Robertson [phone]  Patient has had significant reductions in his seizures since we last saw him.  He is now down to 1 seizure a month.  Previously was seizing about 4 times a month.  He is now on increased medications per neurology.  Last neurology visit is as below.   Saw Neuro 11/2021 This is a 41 yo RH man with a history of "3 brain cysts" s/p surgery 12 years ago in [place], with epilepsy suggetive of focal to bilateral tonic-clonic seizures. He continues to have seizures on 2 ASMs. MRI brain with and without contrast and 1-hour EEG will be ordered. We discussed adding Zonisamide, side effects discussed. Start Zonisamide 100mg : take 1 capsule every night for 2 weeks, then increase to 2 capsules every night for 2 weeks, then increase to 3 capsules every night. Continue Levetiracetam 1000mg  BID and Vimpat 200mg  BID, refills sent. They were advised to keep a calendar of his seizures. We again discussed [place] driving laws to stop driving until 6 months seizure-free. Follow-up in 3 months, call for any changes.   The patient has an MRI scheduled for May 30 he is encouraged to keep this appointment.   Patient is tolerating the Vimpat Keppra and now the zonisamide.  EEG did show a left deform spikes.  When he had a seizure yesterday he was coming out of church he went out to shake the bed was helped to the ground and immediately recovered in the few minutes.  He was not postictal for long period he is not smoking or drinking alcohol and is following a healthy lifestyle.  Patient is taking Lexapro doing well with this his mood and affect have improved.  He does have poor dentition and would  benefit from dental intervention.  He has no other complaints.   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   Body mass index (BMI) 22.0-22.9, adult 01/22/2021   Past Medical History:  Diagnosis Date   Depression    Epilepsy ([place])    Pneumothorax 01/28/2021   Seizure ([place])    Seizures ([place])    Traumatic brain injury with loss of consciousness ([place]) 01/22/2021   Past Surgical History:  Procedure Laterality Date   BRAIN SURGERY     Social History   Tobacco Use   Smoking status: Never   Smokeless tobacco: Never  Vaping Use   Vaping Use: Never used  Substance Use Topics   Alcohol use: Not Currently   Drug use: Not Currently   Social History   Socioeconomic History   Marital status: Soil scientist    Spouse name: Not on file   Number of children: Not on file   Years of education: Not on file   Highest education level: Not on file  Occupational History   Not on file  Tobacco Use   Smoking status: Never   Smokeless tobacco: Never  Vaping Use   Vaping Use: Never  used  Substance and Sexual Activity   Alcohol use: Not Currently   Drug use: Not Currently   Sexual activity: Not Currently  Other Topics Concern   Not on file  Social History Narrative   Right handed    Drinks no caffeine    Social Determinants of Health   Financial Resource Strain: Not on file  Food Insecurity: Not on file  Transportation Needs: Not on file  Physical Activity: Not on file  Stress: Not on file  Social Connections: Not on file  Intimate Partner Violence: Not on file   Family Status  Relation Name Status   Neg Hx  (Not Specified)   Family History  Problem Relation Age of Onset   Seizures Neg Hx    No Known Allergies    Review of  Systems  Constitutional:  Negative for chills, diaphoresis, fever, malaise/fatigue and weight loss.  HENT:  Negative for congestion, ear discharge, ear pain, hearing loss, nosebleeds, sore throat and tinnitus.   Eyes:  Negative for blurred vision, double vision, photophobia and discharge.  Respiratory:  Negative for cough, hemoptysis, sputum production, shortness of breath, wheezing and stridor.        No excess mucus  Cardiovascular:  Negative for chest pain, palpitations, orthopnea, claudication, leg swelling and PND.  Gastrointestinal:  Negative for abdominal pain, blood in stool, constipation, diarrhea, heartburn, melena, nausea and vomiting.  Genitourinary:  Negative for dysuria, flank pain, frequency, hematuria and urgency.  Musculoskeletal:  Negative for back pain, falls, joint pain, myalgias and neck pain.  Skin:  Negative for itching and rash.  Neurological:  Positive for seizures. Negative for dizziness, tingling, tremors, sensory change, speech change, focal weakness, loss of consciousness, weakness and headaches.  Endo/Heme/Allergies:  Negative for environmental allergies and polydipsia. Does not bruise/bleed easily.  Psychiatric/Behavioral:  Positive for depression. Negative for hallucinations, memory loss, substance abuse and suicidal ideas. The patient is nervous/anxious. The patient does not have insomnia.   All other systems reviewed and are negative.    Objective:     BP 121/78 (BP Location: Left Arm, Patient Position: Sitting, Cuff Size: Normal)   Pulse 69   Temp 98.7 F (37.1 C) (Oral)   Resp 16   Wt 165 lb (74.8 kg)   SpO2 100%   BMI 24.37 kg/m  BP Readings from Last 3 Encounters:  01/23/22 121/78  12/18/21 123/79  09/10/21 119/76   Wt Readings from Last 3 Encounters:  01/23/22 165 lb (74.8 kg)  12/18/21 169 lb (76.7 kg)  09/10/21 171 lb 3.2 oz (77.7 kg)      Physical Exam   No results found for any visits on 01/23/22.  Last CBC Lab Results   Component Value Date   WBC 8.0 01/31/2021   HGB 13.3 01/31/2021   HCT 41.8 01/31/2021   MCV 86.0 01/31/2021   MCH 27.4 01/31/2021   RDW 12.5 01/31/2021   PLT 271 [id number]   Last metabolic panel Lab Results  Component Value Date   GLUCOSE 100 (H) 01/31/2021   NA 137 01/31/2021   K 3.4 (L) 01/31/2021   CL 101 01/31/2021   CO2 27 01/31/2021   BUN 12 01/31/2021   CREATININE 0.90 01/31/2021   GFRNONAA >60 01/31/2021   CALCIUM 9.0 01/31/2021   PROT 6.9 01/31/2021   ALBUMIN 3.5 01/31/2021   LABGLOB 3.3 01/21/2021   AGRATIO 1.6 01/21/2021   BILITOT 0.5 01/31/2021   ALKPHOS 47 01/31/2021   AST 16 01/31/2021   ALT 16 01/31/2021  ANIONGAP 9 01/31/2021   Last lipids Lab Results  Component Value Date   CHOL 217 (H) 01/21/2021   HDL 47 01/21/2021   LDLCALC 146 (H) 01/21/2021   TRIG 133 01/21/2021   CHOLHDL 4.6 01/21/2021   Last hemoglobin A1c No results found for: HGBA1C    The 10-year ASCVD risk score (Arnett DK, et al., 2019) is: 1.6% ELECTROENCEPHALOGRAM REPORT   Date of Study: 01/01/2022   Patient's Name: Math Finck MRN: [id number] Date of Birth: 13-Aug-1981   Referring Provider: Dr. Ellouise Newer   Clinical History: This is a 41 year old man with recurrent seizures. EEG for classification.   Medications: LEXAPRO 20 MG tablet VIMPAT 200 MG TABS tablet KEPPRA 1000 MG tablet ZONISAMIDE 100mg  capsule   Technical Summary: A multichannel digital 1-hour EEG recording measured by the international 10-20 system with electrodes applied with paste and impedances below 5000 ohms performed in our laboratory with EKG monitoring in an awake and asleep patient.  Hyperventilation was not performed. Photic stimulation was performed.  The digital EEG was referentially recorded, reformatted, and digitally filtered in a variety of bipolar and referential montages for optimal display.     Description: The patient is awake and asleep during the recording.  During  maximal wakefulness, there is a symmetric, medium voltage 10 Hz posterior dominant rhythm that attenuates with eye opening.  The record is symmetric.  During drowsiness and sleep, there is an increase in theta slowing of the background.  Vertex waves and symmetric sleep spindles were seen. Photic stimulation did not elicit any abnormalities.  There were 2 isolated spikes seen over the right frontopolar region in sleep. No electrographic seizures seen.     EKG lead was unremarkable.   Impression: This 1-hour awake and asleep EEG is abnormal due to the presence of rare spikes over the right frontopolar region.   Clinical Correlation of the above findings indicates a tendency for seizures to arise from the right frontopolar region. If further clinical questions remain, prolonged EEG may be helpful.  Clinical correlation is advised.     Ellouise Newer, M.D.     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     Asencion Noble, MD

## 2022-01-23 NOTE — Assessment & Plan Note (Signed)
Mild hyperglycemia noted on previous lab studies  Check hemoglobin A1c

## 2022-01-23 NOTE — Assessment & Plan Note (Signed)
Elevated lipid panel in the past check lipids

## 2022-01-23 NOTE — Progress Notes (Signed)
Seizure yesterday   Omar Robertson [phone]

## 2022-01-23 NOTE — Assessment & Plan Note (Signed)
Recommend dental exam 

## 2022-01-24 ENCOUNTER — Other Ambulatory Visit: Payer: Self-pay | Admitting: Critical Care Medicine

## 2022-01-24 ENCOUNTER — Telehealth: Payer: Self-pay

## 2022-01-24 ENCOUNTER — Other Ambulatory Visit: Payer: Self-pay

## 2022-01-24 LAB — COMPREHENSIVE METABOLIC PANEL
ALT: 14 IU/L (ref 0–44)
AST: 13 IU/L (ref 0–40)
Albumin/Globulin Ratio: 1.6 (ref 1.2–2.2)
Albumin: 4.8 g/dL (ref 4.0–5.0)
Alkaline Phosphatase: 73 IU/L (ref 44–121)
BUN/Creatinine Ratio: 12 (ref 9–20)
BUN: 13 mg/dL (ref 6–24)
Bilirubin Total: 0.3 mg/dL (ref 0.0–1.2)
CO2: 19 mmol/L — ABNORMAL LOW (ref 20–29)
Calcium: 9.1 mg/dL (ref 8.7–10.2)
Chloride: 107 mmol/L — ABNORMAL HIGH (ref 96–106)
Creatinine, Ser: 1.06 mg/dL (ref 0.76–1.27)
Globulin, Total: 3 g/dL (ref 1.5–4.5)
Glucose: 90 mg/dL (ref 70–99)
Potassium: 4.2 mmol/L (ref 3.5–5.2)
Sodium: 141 mmol/L (ref 134–144)
Total Protein: 7.8 g/dL (ref 6.0–8.5)
eGFR: 90 mL/min/{1.73_m2} (ref >59)

## 2022-01-24 LAB — CBC WITH DIFFERENTIAL/PLATELET
Basophils Absolute: 0 10*3/uL (ref 0.0–0.2)
Basos: 0 %
EOS (ABSOLUTE): 0.1 10*3/uL (ref 0.0–0.4)
Eos: 1 %
Hematocrit: 42.3 % (ref 37.5–51.0)
Hemoglobin: 13.9 g/dL (ref 13.0–17.7)
Immature Grans (Abs): 0 10*3/uL (ref 0.0–0.1)
Immature Granulocytes: 0 %
Lymphocytes Absolute: 1.7 10*3/uL (ref 0.7–3.1)
Lymphs: 18 %
MCH: 27.3 pg (ref 26.6–33.0)
MCHC: 32.9 g/dL (ref 31.5–35.7)
MCV: 83 fL (ref 79–97)
Monocytes Absolute: 0.7 10*3/uL (ref 0.1–0.9)
Monocytes: 7 %
Neutrophils Absolute: 7.1 10*3/uL — ABNORMAL HIGH (ref 1.4–7.0)
Neutrophils: 74 %
Platelets: 278 10*3/uL (ref 150–450)
RBC: 5.1 x10E6/uL (ref 4.14–5.80)
RDW: 13 % (ref 11.6–15.4)
WBC: 9.5 10*3/uL (ref 3.4–10.8)

## 2022-01-24 LAB — HEMOGLOBIN A1C
Est. average glucose Bld gHb Est-mCnc: 108 mg/dL
Hgb A1c MFr Bld: 5.4 % (ref 4.8–5.6)

## 2022-01-24 LAB — LIPID PANEL
Chol/HDL Ratio: 4.9 ratio (ref 0.0–5.0)
Cholesterol, Total: 190 mg/dL (ref 100–199)
HDL: 39 mg/dL — ABNORMAL LOW (ref >39)
LDL Chol Calc (NIH): 132 mg/dL — ABNORMAL HIGH (ref 0–99)
Triglycerides: 104 mg/dL (ref 0–149)
VLDL Cholesterol Cal: 19 mg/dL (ref 5–40)

## 2022-01-24 MED ORDER — ATORVASTATIN CALCIUM 10 MG PO TABS
10.0000 mg | ORAL_TABLET | Freq: Every day | ORAL | 3 refills | Status: DC
  Filled 2022-01-24: qty 30, 30d supply, fill #0
  Filled 2022-01-28: qty 90, 90d supply, fill #0
  Filled 2022-04-28: qty 30, 30d supply, fill #1
  Filled 2022-06-09: qty 30, 30d supply, fill #2
  Filled ????-??-??: fill #3

## 2022-01-24 NOTE — Telephone Encounter (Signed)
-----   Message from Storm Frisk, MD sent at 01/24/2022  5:56 AM EDT ----- Let pt know all labs normal, except high cholesterol,  pt needs to start atorvastatin one pill daily sent to our pharmacy

## 2022-01-24 NOTE — Telephone Encounter (Signed)
Called pt but wasn't able to talk   Interpreter [phone]

## 2022-01-28 ENCOUNTER — Other Ambulatory Visit: Payer: Self-pay

## 2022-01-29 ENCOUNTER — Ambulatory Visit (HOSPITAL_COMMUNITY)
Admission: RE | Admit: 2022-01-29 | Discharge: 2022-01-29 | Disposition: A | Discharge status: Home / Self Care | Payer: 59 | Source: Ambulatory Visit | Attending: Neurology | Admitting: Neurology

## 2022-01-29 ENCOUNTER — Other Ambulatory Visit: Payer: Self-pay

## 2022-01-29 DIAGNOSIS — G40802 Other epilepsy, not intractable, without status epilepticus: Secondary | ICD-10-CM | POA: Insufficient documentation

## 2022-01-29 MED ORDER — GADOBUTROL 1 MMOL/ML IV SOLN
8.0000 mL | Freq: Once | INTRAVENOUS | Status: AC | PRN
  Administered 2022-01-29: 8 mL via INTRAVENOUS

## 2022-02-19 ENCOUNTER — Other Ambulatory Visit: Payer: Self-pay

## 2022-02-25 ENCOUNTER — Other Ambulatory Visit: Payer: Self-pay

## 2022-02-26 ENCOUNTER — Other Ambulatory Visit: Payer: Self-pay

## 2022-02-27 ENCOUNTER — Other Ambulatory Visit: Payer: Self-pay

## 2022-03-18 ENCOUNTER — Other Ambulatory Visit: Payer: Self-pay

## 2022-03-26 ENCOUNTER — Encounter: Payer: Self-pay | Admitting: Neurology

## 2022-03-26 ENCOUNTER — Ambulatory Visit: Payer: 59 | Admitting: Neurology

## 2022-03-26 ENCOUNTER — Other Ambulatory Visit: Payer: Self-pay

## 2022-03-26 DIAGNOSIS — G40802 Other epilepsy, not intractable, without status epilepticus: Secondary | ICD-10-CM | POA: Diagnosis not present

## 2022-03-26 MED ORDER — LACOSAMIDE 200 MG PO TABS
200.0000 mg | ORAL_TABLET | Freq: Two times a day (BID) | ORAL | 5 refills | Status: DC
  Filled 2022-03-26: qty 60, 30d supply, fill #0
  Filled 2022-04-28 (×2): qty 60, 30d supply, fill #1
  Filled 2022-06-09: qty 60, 30d supply, fill #2
  Filled 2022-07-07: qty 60, 30d supply, fill #3
  Filled 2022-08-05: qty 60, 30d supply, fill #4

## 2022-03-26 MED ORDER — ZONISAMIDE 100 MG PO CAPS
ORAL_CAPSULE | ORAL | 6 refills | Status: DC
  Filled 2022-03-26: qty 120, fill #0
  Filled 2022-04-08: qty 120, 30d supply, fill #0
  Filled 2022-05-06: qty 120, 30d supply, fill #1
  Filled 2022-06-09: qty 120, 30d supply, fill #2
  Filled 2022-07-07: qty 120, 30d supply, fill #3
  Filled 2022-08-05: qty 120, 30d supply, fill #4

## 2022-03-26 MED ORDER — LEVETIRACETAM 1000 MG PO TABS
1000.0000 mg | ORAL_TABLET | Freq: Two times a day (BID) | ORAL | 11 refills | Status: DC
  Filled 2022-04-28: qty 60, 30d supply, fill #0
  Filled 2022-06-09: qty 60, 30d supply, fill #1
  Filled 2022-07-07: qty 60, 30d supply, fill #2
  Filled 2022-08-05: qty 60, 30d supply, fill #3

## 2022-03-26 NOTE — Patient Instructions (Addendum)
Que bueno verte.  1. Aumentar Zonisamide 100mg : Tomar 4 cpsulas cada noche  2. Contine con levetiracetam 1000 mg: tome 1 tableta dos veces al da  3. Contine con Lacosamida 200 mg: tome 1 tableta dos veces al da  4. Continuar con el calendario de incautaciones. Seguimiento en 3-4 meses, llame para cualquier cambio.   Precauciones para las convulsiones: 1. Si le han recetado medicamentos para prevenir las convulsiones, tmelos exactamente como se indica. No deje de tomar el medicamento sin hablar primero con su mdico, incluso si no ha tenido Mar 6.  2. Evite actividades en las que una convulsin pueda causarle peligro a usted mismo oa otros. No opere maquinaria peligrosa, nade solo ni suba a lugares altos o peligrosos, como escaleras, techos o vigas. No conduzca a menos que su mdico le diga que puede [place].  3. Si tiene [place] advertencia de que puede tener una convulsin, acustese en un lugar seguro donde no pueda lastimarse.  4. No conducir durante 6 meses desde la ltima incautacin, segn la ley estatal de [place]. Consulte el siguiente enlace en el sitio web de la Epilepsy Foundation of [place] para obtener ms informacin: http://www.epilepsyfoundation.org/answerplace/Social/driving/drivingu.cfm  5. Mantenga una buena higiene del sueo.  6. Pngase en contacto con su mdico si tiene algn problema que pueda estar relacionado con el medicamento que est tomando.  7. Llame al 911 y lleve al paciente de vuelta al servicio de urgencias si:       A. La convulsin dura ms de 5 minutos. B. El paciente no se despierta poco despus de la convulsin. C. El paciente tiene problemas nuevos, como dificultad para ver, hablar o moverse. D. El paciente result herido durante la convulsin. E. El paciente tiene [place] superior a 102 F (39 C) F. El paciente vomit y ahora tiene problemas para respirar     Good to see you.  Increase  Zonisamide 100mg : Take 4 capsules every night  2. Continue Levetiracetam 1000mg : take 1 tablet twice a day  3. Continue Lacosamide 200mg : take 1 tablet twice a day  4. Continue seizure calendar. Follow-up in 3-4 months, call for any changes.    Seizure Precautions: 1. If medication has been prescribed for you to prevent seizures, take it exactly as directed.  Do not stop taking the medicine without talking to your doctor first, even if you have not had a seizure in a long time.   2. Avoid activities in which a seizure would cause danger to yourself or to others.  Don't operate dangerous machinery, swim alone, or climb in high or dangerous places, such as on ladders, roofs, or girders.  Do not drive unless your doctor says you may.  3. If you have any warning that you may have a seizure, lay down in a safe place where you can't hurt yourself.    4.  No driving for 6 months from last seizure, as per [facility].   Please refer to the following link on the Epilepsy Foundation of [employer]'s website for more information: http://www.epilepsyfoundation.org/answerplace/Social/driving/drivingu.cfm   5.  Maintain good sleep hygiene.  6.  Contact your doctor if you have any problems that may be related to the medicine you are taking.  7.  Call 911 and bring the patient back to the ED if:        A.  The seizure lasts longer than 5 minutes.       B.  The patient doesn't awaken  shortly after the seizure  C.  The patient has new problems such as difficulty seeing, speaking or moving  D.  The patient was injured during the seizure  E.  The patient has a temperature over 102 F (39C)  F.  The patient vomited and now is having trouble breathing

## 2022-03-26 NOTE — Progress Notes (Signed)
NEUROLOGY FOLLOW UP OFFICE NOTE  Omar Robertson [id number] 06/14/1981  HISTORY OF PRESENT ILLNESS: I had the pleasure of seeing Omar Robertson in follow-up in the neurology clinic on 03/26/2022.  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 He is again accompanied by his wife who helps supplement the history today. A Spanish medical interpreter, Byrd Hesselbach, helps with translation. Records and images were personally reviewed where available.  I personally reviewed MRI brain with and without contrast done 12/2021 which did not show any acute changes. 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 His 1-hour wake and sleep EEG in 12/2021 showed rare spikes over the right frontopolar region. On his last visit Zonisamide 300mg  qhs was added to Levetiracetam 1000mg  BID and Lacosamide 200mg  BID. They report a reduction in seizure frequency and intensity. He had a seizure on 6/10, and most recently 7/24 where he had behavioral arrest, staring, drooling and head turn to the left. No convulsive activity seen. She has noticed the seizures tend to occur around emotional periods, such as a birthday or when he is sad. No side effects on medications. No falls.   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 They report seizures started around age 23, he was hospitalized and the cysts were found. Surgery was done on the 2 cysts, they were told the 3rd cyst was not amenable to surgery. Records unavailable for review. Head CT in 07/2020 showed remote right frontal craniotomy and burr hole, postsurgical changes in the  right frontal lobe, 51mm colloid cyst at the level of the foramina of [place]. He describes seizures as drifting off in his mind, "thinks of something else," then does not know what happens. 33 has witnessed seizures where his face turns red, he would be drooling, unresponsive, then fall. Sometimes he would have brief shaking lasting for 1 minute. He used to bite his tongue. He would be confused for 15 minutes, then would have difficulty sleeping that night, awake the whole night until the next day. No focal weakness but 08/2020 notes he has "some kind of discomfort" after the seizures. He has around 2 seizures a month, brought on "by surprise that caused him a big emotion." Last seizure was 08/28/21. They report he was tried on different unrecalled medications in the past but continued to have seizures. He has been on Levetiracetam for many years, when they moved to [place], Vimpat was started 3 years ago. He was on Vimpat 200mg  BID and Levetiracetam 1000mg  BID, however during hospital admission for seizures in 12/2020 (in setting of running out of medication), he was discharged on Vimpat 100mg  BID, notes indicate "Per pharmacist, cost of vimpat may be prohibitive for pt. Would discharge on slightly lower dose which would be fully covered and have pt f/u with [facility] as per [facility] documentation for further refills." His son reports an increase in seizures since reduction of dose.   He denies any olfactory/gustatory hallucinations, deja vu, rising epigastric sensation, focal numbness/tingling/weakness, myoclonic jerks. He denies any headaches, dizziness, vision changes, no falls. He forgets a lot of things, they report a lot of memory loss where he could not remember  conversations. He is currently unemployed and not driving.   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 He reports that he fell  and injured his [place] a child [place] he had stitches but was told "there is still blood in the brain." Maternal cousin has seizures. He had a normal birth and early development.  There is no history of febrile convulsions, CNS infections such as meningitis/encephalitis.   PAST MEDICAL HISTORY: Past Medical History:  Diagnosis Date   Depression    Epilepsy (HCC)    Pneumothorax 01/28/2021   Seizure (HCC)    Seizures (HCC)    Traumatic brain injury with loss of consciousness (HCC) 01/22/2021    MEDICATIONS: Current Outpatient Medications on File Prior to Visit  Medication Sig Dispense Refill   atorvastatin (LIPITOR) 10 MG tablet Take 1 tablet (10 mg total) by mouth daily. 90 tablet 3   escitalopram (LEXAPRO) 20 MG tablet Take 1 tablet (20 mg total) by mouth daily. 60 tablet 3   lacosamide (VIMPAT) 200 MG TABS tablet Take 1 tablet (200 mg total) by mouth 2 (two) times daily. 60 tablet 5   levETIRAcetam (KEPPRA) 1000 MG tablet Take 1 tablet (1,000 mg total) by mouth 2 (two) times daily. 60 tablet 11   zonisamide (ZONEGRAN) 100 MG capsule Take 1 capsule every night for 2 weeks, then increase to 2 capsules every night for 2 weeks, then increase to 3 capsules every night 90 capsule 6   No current facility-administered medications on file prior to visit.    ALLERGIES: No Known Allergies  FAMILY HISTORY: Family History  Problem Relation Age of Onset   Seizures Neg Hx     SOCIAL HISTORY: Social History   Socioeconomic History   Marital status: Media planner    Spouse name: Not on file   Number of children: Not on file   Years of education: Not on file   Highest education level: Not on file  Occupational History   Not on file  Tobacco Use   Smoking status: Never   Smokeless tobacco: Never  Vaping Use   Vaping Use: Never used  Substance and Sexual Activity   Alcohol use: Not Currently   Drug use: Not Currently   Sexual activity: Not Currently  Other Topics  Concern   Not on file  Social History Narrative   Right handed    Drinks no caffeine    Social Determinants of Health   Financial Resource Strain: Not on file  Food Insecurity: Not on file  Transportation Needs: Not on file  Physical Activity: Not on file  Stress: Not on file  Social Connections: Not on file  Intimate Partner Violence: Not on file     PHYSICAL EXAM: Vitals:   03/26/22 1252  BP: 108/70  Pulse: 67  SpO2: 100%   General: No acute distress Head:  Normocephalic/atraumatic, pterygium on medial side of both eyes Skin/Extremities: No rash, no edema Neurological Exam: alert and awake. No aphasia or dysarthria. Fund of knowledge is appropriate.  Attention and concentration are normal.   Cranial nerves: Pupils equal, round. Extraocular movements intact.  No facial asymmetry.  Motor: moves all extremities symmetrically. Gait narrow-based and steady, no ataxia.   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 EEG showed rare spikes over the right frontopolar region. There has been some improvement in seizures with addition of Zonisamide, increase to 400mg  qhs. Continue Levetiracetam 1000mg  BID and Lacosamide 200mg  BID. Continue seizure calendar. We discussed intractable epilepsy and consideration for presurgical evaluation in the future. He is aware of [place] driving laws to stop driving until 6 months seizure-free. Follow-up in 3-4 months, call for any changes.    Thank you for allowing me to participate in his care.  Please do not hesitate to call for any questions or concerns.    , M.D.   CC: Dr. 

## 2022-03-27 ENCOUNTER — Other Ambulatory Visit: Payer: Self-pay

## 2022-04-08 ENCOUNTER — Other Ambulatory Visit: Payer: Self-pay

## 2022-04-09 ENCOUNTER — Other Ambulatory Visit: Payer: Self-pay

## 2022-04-28 ENCOUNTER — Other Ambulatory Visit: Payer: Self-pay

## 2022-04-29 ENCOUNTER — Other Ambulatory Visit: Payer: Self-pay

## 2022-04-30 ENCOUNTER — Other Ambulatory Visit: Payer: Self-pay

## 2022-05-06 ENCOUNTER — Other Ambulatory Visit: Payer: Self-pay

## 2022-05-07 ENCOUNTER — Other Ambulatory Visit: Payer: Self-pay

## 2022-05-08 ENCOUNTER — Other Ambulatory Visit: Payer: Self-pay

## 2022-06-09 ENCOUNTER — Other Ambulatory Visit (HOSPITAL_BASED_OUTPATIENT_CLINIC_OR_DEPARTMENT_OTHER): Payer: Self-pay

## 2022-06-09 ENCOUNTER — Other Ambulatory Visit: Payer: Self-pay

## 2022-06-10 ENCOUNTER — Other Ambulatory Visit: Payer: Self-pay

## 2022-06-25 ENCOUNTER — Ambulatory Visit: Payer: 59 | Admitting: Critical Care Medicine

## 2022-06-25 ENCOUNTER — Encounter: Payer: Self-pay | Admitting: Physician Assistant

## 2022-06-25 ENCOUNTER — Other Ambulatory Visit: Payer: Self-pay

## 2022-06-25 ENCOUNTER — Ambulatory Visit: Payer: Self-pay | Attending: Critical Care Medicine | Admitting: Physician Assistant

## 2022-06-25 VITALS — BP 119/87 | HR 72 | Wt 159.0 lb

## 2022-06-25 DIAGNOSIS — E782 Mixed hyperlipidemia: Secondary | ICD-10-CM

## 2022-06-25 DIAGNOSIS — Z789 Other specified health status: Secondary | ICD-10-CM

## 2022-06-25 DIAGNOSIS — F330 Major depressive disorder, recurrent, mild: Secondary | ICD-10-CM

## 2022-06-25 MED ORDER — ESCITALOPRAM OXALATE 20 MG PO TABS
20.0000 mg | ORAL_TABLET | Freq: Every day | ORAL | 3 refills | Status: DC
  Filled 2022-06-25: qty 60, 60d supply, fill #0
  Filled 2022-07-07: qty 30, 30d supply, fill #0
  Filled 2022-08-05: qty 30, 30d supply, fill #1
  Filled 2022-09-03: qty 30, 30d supply, fill #2
  Filled 2022-10-06: qty 30, 30d supply, fill #3
  Filled 2022-11-03: qty 30, 30d supply, fill #4
  Filled 2022-12-01: qty 30, 30d supply, fill #5
  Filled 2023-01-01: qty 30, 30d supply, fill #6
  Filled 2023-02-02: qty 30, 30d supply, fill #7

## 2022-06-25 MED ORDER — ATORVASTATIN CALCIUM 10 MG PO TABS
10.0000 mg | ORAL_TABLET | Freq: Every day | ORAL | 3 refills | Status: DC
  Filled 2022-06-25: qty 90, 90d supply, fill #0
  Filled 2022-07-07: qty 30, 30d supply, fill #0
  Filled 2022-08-05: qty 30, 30d supply, fill #1
  Filled 2022-10-06: qty 30, 30d supply, fill #2

## 2022-06-25 NOTE — Progress Notes (Signed)
Patient ID: Omar Robertson, male   DOB: 1981/07/25, 42 y.o.   MRN: [id number]   Omar Robertson, is a 41 y.o. male  MAU:[id number]  BWL:[id number]  DOB - Oct 22, 1980  No chief complaint on file.      Subjective:   Omar Robertson is a 41 y.o. male here today for med RF.  His wife is here with him.  Stable on meds.  Last saw neuro in July.  Denies SI/HI.    No problems updated.  ALLERGIES: No Known Allergies  PAST MEDICAL HISTORY: Past Medical History:  Diagnosis Date   Depression    Epilepsy ([place])    Pneumothorax 01/28/2021   Seizure ([place])    Seizures ([place])    Traumatic brain injury with loss of consciousness ([place]) 01/22/2021    MEDICATIONS AT HOME: Prior to Admission medications   Medication Sig Start Date End Date Taking? Authorizing Provider  lacosamide (VIMPAT) 200 MG TABS tablet Take 1 tablet (200 mg total) by mouth 2 (two) times daily. 03/26/22  Yes Cameron Sprang, MD  levETIRAcetam (KEPPRA) 1000 MG tablet Take 1 tablet (1,000 mg total) by mouth 2 (two) times daily. 03/26/22  Yes Cameron Sprang, MD  zonisamide [facility]) 100 MG capsule Take 4 capsules by mouth every night. 03/26/22  Yes Cameron Sprang, MD  atorvastatin (LIPITOR) 10 MG tablet Take 1 tablet (10 mg total) by mouth daily. 06/25/22   Argentina Donovan, PA-C  escitalopram (LEXAPRO) 20 MG tablet Take 1 tablet (20 mg total) by mouth daily. 06/25/22   Payslie Mccaig, Dionne Bucy, PA-C    ROS: Neg HEENT Neg resp Neg cardiac Neg GI Neg GU Neg MS Neg psych Neg neuro  Objective:   Vitals:   06/25/22 1125  BP: 119/87  Pulse: 72  SpO2: 97%  Weight: 159 lb (72.1 kg)   Exam General appearance : Awake, alert, not in any distress. Speech Clear. Not toxic looking HEENT: Atraumatic and Normocephalic Neck: Supple, no JVD. No cervical lymphadenopathy.  Chest: Good air entry bilaterally, CTAB.  No rales/rhonchi/wheezing CVS: S1 S2 regular, no murmurs.  Extremities: B/L Lower Ext shows no  edema, both legs are warm to touch Neurology: Awake alert, and oriented X 3, CN II-XII intact, Non focal Skin: No Rash  Data Review Lab Results  Component Value Date   HGBA1C 5.4 01/23/2022    Assessment & Plan   1. Mixed hyperlipidemia - Lipid panel - Hepatic Function Panel - atorvastatin (LIPITOR) 10 MG tablet; Take 1 tablet (10 mg total) by mouth daily.  Dispense: 90 tablet; Refill: 3  2. Mild episode of recurrent major depressive disorder (HCC) stable - escitalopram (LEXAPRO) 20 MG tablet; Take 1 tablet (20 mg total) by mouth daily.  Dispense: 60 tablet; Refill: 3  3. Language barrier [employer] "Herbie Baltimore" interpreters used and additional time performing visit was required.     Return in about 6 months (around 12/25/2022) for PCP for chronic conditions.  The patient was given clear instructions to go to ER or return to medical center if symptoms don't improve, worsen or new problems develop. The patient verbalized understanding. The patient was told to call to get lab results if they haven't heard anything in the next week.      Freeman Caldron, PA-C [facility] and [facility], [place]   06/25/2022, 11:52 AM

## 2022-06-26 LAB — LIPID PANEL
Chol/HDL Ratio: 4.4 ratio (ref 0.0–5.0)
Cholesterol, Total: 160 mg/dL (ref 100–199)
HDL: 36 mg/dL — ABNORMAL LOW (ref >39)
LDL Chol Calc (NIH): 103 mg/dL — ABNORMAL HIGH (ref 0–99)
Triglycerides: 118 mg/dL (ref 0–149)
VLDL Cholesterol Cal: 21 mg/dL (ref 5–40)

## 2022-06-26 LAB — HEPATIC FUNCTION PANEL
ALT: 23 IU/L (ref 0–44)
AST: 17 IU/L (ref 0–40)
Albumin: 4.8 g/dL (ref 4.1–5.1)
Alkaline Phosphatase: 64 IU/L (ref 44–121)
Bilirubin Total: 0.2 mg/dL (ref 0.0–1.2)
Bilirubin, Direct: <0.1 mg/dL (ref 0.00–0.40)
Total Protein: 7.5 g/dL (ref 6.0–8.5)

## 2022-07-03 ENCOUNTER — Ambulatory Visit: Payer: Self-pay | Admitting: Neurology

## 2022-07-03 ENCOUNTER — Encounter: Payer: Self-pay | Admitting: Neurology

## 2022-07-03 DIAGNOSIS — Z029 Encounter for administrative examinations, unspecified: Secondary | ICD-10-CM

## 2022-07-07 ENCOUNTER — Other Ambulatory Visit: Payer: Self-pay

## 2022-07-08 ENCOUNTER — Other Ambulatory Visit: Payer: Self-pay

## 2022-07-09 ENCOUNTER — Other Ambulatory Visit: Payer: Self-pay

## 2022-08-05 ENCOUNTER — Other Ambulatory Visit: Payer: Self-pay

## 2022-08-06 ENCOUNTER — Other Ambulatory Visit: Payer: Self-pay

## 2022-08-07 ENCOUNTER — Encounter: Payer: Self-pay | Admitting: Neurology

## 2022-08-07 ENCOUNTER — Other Ambulatory Visit: Payer: Self-pay

## 2022-08-07 ENCOUNTER — Ambulatory Visit (INDEPENDENT_AMBULATORY_CARE_PROVIDER_SITE_OTHER): Payer: Self-pay | Admitting: Neurology

## 2022-08-07 DIAGNOSIS — G40802 Other epilepsy, not intractable, without status epilepticus: Secondary | ICD-10-CM | POA: Diagnosis not present

## 2022-08-07 MED ORDER — LEVETIRACETAM 1000 MG PO TABS
1000.0000 mg | ORAL_TABLET | Freq: Two times a day (BID) | ORAL | 11 refills | Status: DC
  Filled 2022-08-07 – 2022-09-03 (×2): qty 60, 30d supply, fill #0
  Filled 2022-10-06: qty 60, 30d supply, fill #1
  Filled 2022-11-03: qty 60, 30d supply, fill #2

## 2022-08-07 MED ORDER — ZONISAMIDE 100 MG PO CAPS
ORAL_CAPSULE | ORAL | 6 refills | Status: DC
  Filled 2022-08-07: qty 150, fill #0
  Filled 2022-09-03: qty 150, 30d supply, fill #0
  Filled 2022-10-06 (×2): qty 150, 30d supply, fill #1
  Filled 2022-11-03: qty 150, 30d supply, fill #2

## 2022-08-07 MED ORDER — LACOSAMIDE 200 MG PO TABS
200.0000 mg | ORAL_TABLET | Freq: Two times a day (BID) | ORAL | 5 refills | Status: DC
  Filled 2022-08-07 – 2022-09-03 (×2): qty 60, 30d supply, fill #0
  Filled 2022-10-06: qty 60, 30d supply, fill #1
  Filled 2022-11-03: qty 60, 30d supply, fill #2

## 2022-08-07 NOTE — Addendum Note (Signed)
Addended by: Dimas Chyle on: 08/07/2022 09:40 AM   Modules accepted: Orders

## 2022-08-07 NOTE — Progress Notes (Signed)
NEUROLOGY FOLLOW UP OFFICE NOTE  Omar Robertson [id number] 12/22/1980  HISTORY OF PRESENT ILLNESS: I had the pleasure of seeing Omar Robertson in follow-up in the neurology clinic on 08/07/2022.  The patient was last seen 4 months ago for intractable epilepsy. He is again accompanied by his wife who helps supplement the history today. A Spanish medical interpreter Scarlette Calico helps with translation.  Records and images were personally reviewed where available.  On his last visit, they reported a reduction in focal seizures with addition of Zonisamide, dose increased to 400mg  qhs. He is also on Levetiracetam 1000mg  BID and Lacosamide 200mg  BID. Since then, he has around one seizure a month. No clear triggers, no missed medications, sleep is good, no alcohol use. He did have 2 in one day last 08/04/22 lasting 2 minutes each. He would be kind of dazed for a little bit then come back. His son witnessed him staring/not responding. He states that he has not fallen to the ground or lost memory, no tongue bite or incontinence. No convulsive activity. He denies any headaches, dizziness, diplopia, focal numbness/tingling/weakness. Mood is good. He denies any side effects on medications, however they do report occasional loss of appetite and some weight loss. He says he is not hungry. We discussed this may be a side effect of Zonisamide. They report he was previously on Vitamin D when they were in .    History on Initial Assessment 09/10/2021: This is a 40 year old right-handed man with a history of "3 brain cysts" s/p surgery 12 years ago in [place], presenting to establish care for epilepsy. They report seizures started around age 61, he was hospitalized and the cysts were found. Surgery was done on the 2 cysts, they were told the 3rd cyst was not amenable to surgery. Records unavailable for review. Head CT in 07/2020 showed remote right frontal craniotomy and burr hole, postsurgical changes in the right  frontal lobe, 72mm colloid cyst at the level of the foramina of [place]. He describes seizures as drifting off in his mind, "thinks of something else," then does not know what happens. 33 has witnessed seizures where his face turns red, he would be drooling, unresponsive, then fall. Sometimes he would have brief shaking lasting for 1 minute. He used to bite his tongue. He would be confused for 15 minutes, then would have difficulty sleeping that night, awake the whole night until the next day. No focal weakness but 08/2020 notes he has "some kind of discomfort" after the seizures. He has around 2 seizures a month, brought on "by surprise that caused him a big emotion." Last seizure was 08/28/21. They report he was tried on different unrecalled medications in the past but continued to have seizures. He has been on Levetiracetam for many years, when they moved to [place], Vimpat was started 3 years ago. He was on Vimpat 200mg  BID and Levetiracetam 1000mg  BID, however during hospital admission for seizures in 12/2020 (in setting of running out of medication), he was discharged on Vimpat 100mg  BID, notes indicate "Per pharmacist, cost of vimpat may be prohibitive for pt. Would discharge on slightly lower dose which would be fully covered and have pt f/u with [facility] as per [facility] documentation for further refills." His son reports an increase in seizures since reduction of dose.   He denies any olfactory/gustatory hallucinations, deja vu, rising epigastric sensation, focal numbness/tingling/weakness, myoclonic jerks. He denies any headaches, dizziness, vision changes, no falls. He forgets a lot  of things, they report a lot of memory loss where he could not remember conversations. He is currently unemployed and not driving.   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 He reports that he fell and  injured his [place] a child [place] he had stitches but was told "there is still blood in the brain." Maternal cousin has seizures. He had a normal birth and early development.  There is no history of febrile convulsions, CNS infections such as meningitis/encephalitis.  PAST MEDICAL HISTORY: Past Medical History:  Diagnosis Date   Depression    Epilepsy (HCC)    Pneumothorax 01/28/2021   Seizure (HCC)    Seizures (HCC)    Traumatic brain injury with loss of consciousness (HCC) 01/22/2021    MEDICATIONS: Current Outpatient Medications on File Prior to Visit  Medication Sig Dispense Refill   atorvastatin (LIPITOR) 10 MG tablet Take 1 tablet (10 mg total) by mouth daily. 90 tablet 3   escitalopram (LEXAPRO) 20 MG tablet Take 1 tablet (20 mg total) by mouth daily. 60 tablet 3   lacosamide (VIMPAT) 200 MG TABS tablet Take 1 tablet (200 mg total) by mouth 2 (two) times daily. 60 tablet 5   levETIRAcetam (KEPPRA) 1000 MG tablet Take 1 tablet (1,000 mg total) by mouth 2 (two) times daily. 60 tablet 11   zonisamide (ZONEGRAN) 100 MG capsule Take 4 capsules by mouth every night. 120 capsule 6   No current facility-administered medications on file prior to visit.    ALLERGIES: No Known Allergies  FAMILY HISTORY: Family History  Problem Relation Age of Onset   Seizures Neg Hx     SOCIAL HISTORY: Social History   Socioeconomic History   Marital status: Media planner    Spouse name: Not on file   Number of children: Not on file   Years of education: Not on file   Highest education level: Not on file  Occupational History   Not on file  Tobacco Use   Smoking status: Never   Smokeless tobacco: Never  Vaping Use   Vaping Use: Never used  Substance and Sexual Activity   Alcohol use: Not Currently   Drug use: Not Currently   Sexual activity: Not Currently  Other Topics Concern   Not on file  Social History Narrative   Right handed    Drinks no caffeine    Social  Determinants of Health   Financial Resource Strain: Not on file  Food Insecurity: Not on file  Transportation Needs: Not on file  Physical Activity: Not on file  Stress: Not on file  Social Connections: Not on file  Intimate Partner Violence: Not on file     PHYSICAL EXAM: Vitals:   08/07/22 0809  BP: 129/80  Pulse: 67  SpO2: 99%   General: No acute distress Head:  Normocephalic/atraumatic, pterygium on medial side of both eyes Skin/Extremities: No rash, no edema Neurological Exam: alert and awake. No aphasia or dysarthria. Fund of knowledge is appropriate.  Attention and concentration are normal.   Cranial nerves: Pupils equal, round. Extraocular movements intact with no nystagmus. Visual fields full.  No facial asymmetry.  Motor: Bulk and tone normal, muscle strength 5/5 throughout with no pronator drift.   Finger to nose testing intact.  Gait narrow-based and steady, able to tandem walk adequately.  Romberg negative.   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 EEG showed rare spikes over the right frontopolar region. There has been some improvement in seizures with addition of Zonisamide, we discussed increasing further to 500mg  qhs. Monitor weight loss/appetite. Continue Levetiracetam 1000mg  BID and Lacosamide 200mg  BID. Continue seizure calendar. Check vitamin D level. He is aware of [place] driving laws to stop driving until 6 months seizure-free. Follow-up in 3-4 months, call for any changes.   Thank you for allowing me to participate in his care.  Please do not hesitate to call for any questions or concerns.    , M.D.   CC: Dr.  

## 2022-08-07 NOTE — Patient Instructions (Addendum)
Que bueno verte.  1. Aumentar Zonisamida 100 mg: tomar 5 cpsulas cada noche  2. Contine con levetiracetam 1000 mg [place] y lacosamida 200 mg [employer].  3. Mantenga un calendario de sus convulsiones  4. Seguimiento en 3-4 meses, llame para cualquier cambio.   Precauciones ante las convulsiones: 1. Si le han recetado medicamentos para prevenir las convulsiones, tmelos exactamente como se le indica. No deje de tomar el medicamento sin consultar primero con su mdico, incluso si no ha tenido Medical laboratory scientific officer.  2. Evite actividades en las que una convulsin pueda causar peligro para usted o para los dems. No opere maquinaria peligrosa, nade solo ni trepe a lugares altos o peligrosos, como escaleras, techos o vigas. No conduzca a menos que su mdico se lo indique.  3. Si tiene algn aviso de que puede sufrir una convulsin, acustese en un lugar seguro donde no pueda lastimarse.  4. No conducir durante 6 meses desde la ltima incautacin, segn la ley estatal de [place]. Consulte el siguiente enlace en el sitio web de la Epilepsy Foundation of [place] para obtener ms informacin: http://www.epilepsyfoundation.org/answerplace/Social/driving/drivingu.cfm  5. Mantenga una buena higiene del sueo. Evite el alcohol  6. Comunquese con su mdico si tiene algn problema que pueda estar relacionado con el medicamento que est tomando.  7. Llame al 911 y lleve al paciente de regreso al servicio de urgencias si:       R. La convulsin dura ms de 5 minutos. B. El paciente no despierta poco despus de la convulsin. C. El paciente tiene nuevos problemas como dificultad para ver, hablar o moverse. D. El paciente result herido durante la convulsin. E. El paciente tiene [place] superior a 102 F (39 C) F. El paciente vomit y ahora tiene problemas para Ambulance person.   Good to see you.  Increase Zonisamide 100mg : take 5 capsules every night  2.  Continue Levetiracetam 1000mg  twice a day and Lacosamide 200mg  twice a day  3. Keep a calendar of your seizures  4. Follow-up in 3-4 months, call for any changes   Seizure Precautions: 1. If medication has been prescribed for you to prevent seizures, take it exactly as directed.  Do not stop taking the medicine without talking to your doctor first, even if you have not had a seizure in a long time.   2. Avoid activities in which a seizure would cause danger to yourself or to others.  Don't operate dangerous machinery, swim alone, or climb in high or dangerous places, such as on ladders, roofs, or girders.  Do not drive unless your doctor says you may.  3. If you have any warning that you may have a seizure, lay down in a safe place where you can't hurt yourself.    4.  No driving for 6 months from last seizure, as per [facility].   Please refer to the following link on the [place] website for more information: http://www.epilepsyfoundation.org/answerplace/Social/driving/drivingu.cfm   5.  Maintain good sleep hygiene. Avoid alcohol  6.  Contact your doctor if you have any problems that may be related to the medicine you are taking.  7.  Call 911 and bring the patient back to the ED if:        A.  The seizure lasts longer than 5 minutes.       B.  The patient doesn't awaken shortly after the seizure  C.  The patient has  new problems such as difficulty seeing, speaking or moving  D.  The patient was injured during the seizure  E.  The patient has a temperature over 102 F (39C)  F.  The patient vomited and now is having trouble breathing

## 2022-09-03 ENCOUNTER — Other Ambulatory Visit: Payer: Self-pay

## 2022-09-04 ENCOUNTER — Other Ambulatory Visit: Payer: Self-pay

## 2022-10-06 ENCOUNTER — Other Ambulatory Visit: Payer: Self-pay

## 2022-10-07 ENCOUNTER — Other Ambulatory Visit: Payer: Self-pay

## 2022-10-27 ENCOUNTER — Inpatient Hospital Stay (HOSPITAL_COMMUNITY)
Admission: EM | Admit: 2022-10-27 | Discharge: 2022-10-29 | DRG: 101 | Disposition: A | Discharge status: Home / Self Care | Payer: Commercial Managed Care - HMO | Attending: Internal Medicine | Admitting: Internal Medicine

## 2022-10-27 ENCOUNTER — Emergency Department (HOSPITAL_COMMUNITY): Payer: Commercial Managed Care - HMO

## 2022-10-27 ENCOUNTER — Other Ambulatory Visit: Payer: Self-pay

## 2022-10-27 DIAGNOSIS — E782 Mixed hyperlipidemia: Secondary | ICD-10-CM | POA: Diagnosis present

## 2022-10-27 DIAGNOSIS — Z72820 Sleep deprivation: Secondary | ICD-10-CM | POA: Diagnosis not present

## 2022-10-27 DIAGNOSIS — R569 Unspecified convulsions: Secondary | ICD-10-CM

## 2022-10-27 DIAGNOSIS — E876 Hypokalemia: Secondary | ICD-10-CM | POA: Diagnosis present

## 2022-10-27 DIAGNOSIS — G40909 Epilepsy, unspecified, not intractable, without status epilepticus: Secondary | ICD-10-CM | POA: Diagnosis present

## 2022-10-27 DIAGNOSIS — Z87820 Personal history of traumatic brain injury: Secondary | ICD-10-CM

## 2022-10-27 DIAGNOSIS — R443 Hallucinations, unspecified: Secondary | ICD-10-CM | POA: Diagnosis present

## 2022-10-27 DIAGNOSIS — R413 Other amnesia: Secondary | ICD-10-CM | POA: Diagnosis present

## 2022-10-27 DIAGNOSIS — E875 Hyperkalemia: Secondary | ICD-10-CM | POA: Diagnosis present

## 2022-10-27 DIAGNOSIS — Z91148 Patient's other noncompliance with medication regimen for other reason: Secondary | ICD-10-CM | POA: Diagnosis not present

## 2022-10-27 DIAGNOSIS — D649 Anemia, unspecified: Secondary | ICD-10-CM | POA: Diagnosis present

## 2022-10-27 DIAGNOSIS — Z79899 Other long term (current) drug therapy: Secondary | ICD-10-CM

## 2022-10-27 DIAGNOSIS — F32A Depression, unspecified: Secondary | ICD-10-CM

## 2022-10-27 DIAGNOSIS — S069XAA Unspecified intracranial injury with loss of consciousness status unknown, initial encounter: Secondary | ICD-10-CM | POA: Insufficient documentation

## 2022-10-27 LAB — CBC
HCT: 44.4 % (ref 39.0–52.0)
Hemoglobin: 13.8 g/dL (ref 13.0–17.0)
MCH: 27.1 pg (ref 26.0–34.0)
MCHC: 31.1 g/dL (ref 30.0–36.0)
MCV: 87.1 fL (ref 80.0–100.0)
Platelets: 304 10*3/uL (ref 150–400)
RBC: 5.1 MIL/uL (ref 4.22–5.81)
RDW: 13.2 % (ref 11.5–15.5)
WBC: 8.5 10*3/uL (ref 4.0–10.5)
nRBC: 0 % (ref 0.0–0.2)

## 2022-10-27 LAB — URINALYSIS, MICROSCOPIC (REFLEX): Bacteria, UA: NONE SEEN

## 2022-10-27 LAB — URINALYSIS, ROUTINE W REFLEX MICROSCOPIC
Bilirubin Urine: NEGATIVE
Glucose, UA: NEGATIVE mg/dL
Ketones, ur: NEGATIVE mg/dL
Leukocytes,Ua: NEGATIVE
Nitrite: NEGATIVE
Protein, ur: NEGATIVE mg/dL
Specific Gravity, Urine: <1.005 — ABNORMAL LOW (ref 1.005–1.030)
pH: 6 (ref 5.0–8.0)

## 2022-10-27 LAB — COMPREHENSIVE METABOLIC PANEL
ALT: 18 U/L (ref 0–44)
AST: 19 U/L (ref 15–41)
Albumin: 4.4 g/dL (ref 3.5–5.0)
Alkaline Phosphatase: 68 U/L (ref 38–126)
Anion gap: 11 (ref 5–15)
BUN: 12 mg/dL (ref 6–20)
CO2: 21 mmol/L — ABNORMAL LOW (ref 22–32)
Calcium: 9.4 mg/dL (ref 8.9–10.3)
Chloride: 110 mmol/L (ref 98–111)
Creatinine, Ser: 1.06 mg/dL (ref 0.61–1.24)
GFR, Estimated: >60 mL/min (ref >60)
Glucose, Bld: 107 mg/dL — ABNORMAL HIGH (ref 70–99)
Potassium: 3.5 mmol/L (ref 3.5–5.1)
Sodium: 142 mmol/L (ref 135–145)
Total Bilirubin: 0.5 mg/dL (ref 0.3–1.2)
Total Protein: 8 g/dL (ref 6.5–8.1)

## 2022-10-27 LAB — ETHANOL: Alcohol, Ethyl (B): <10 mg/dL (ref <10)

## 2022-10-27 LAB — AMMONIA: Ammonia: 14 umol/L (ref 9–35)

## 2022-10-27 LAB — RAPID URINE DRUG SCREEN, HOSP PERFORMED
Amphetamines: NOT DETECTED
Barbiturates: NOT DETECTED
Benzodiazepines: NOT DETECTED
Cocaine: NOT DETECTED
Opiates: NOT DETECTED
Tetrahydrocannabinol: NOT DETECTED

## 2022-10-27 LAB — ACETAMINOPHEN LEVEL: Acetaminophen (Tylenol), Serum: <10 ug/mL — ABNORMAL LOW (ref 10–30)

## 2022-10-27 LAB — SALICYLATE LEVEL: Salicylate Lvl: <7 mg/dL — ABNORMAL LOW (ref 7.0–30.0)

## 2022-10-27 LAB — CBG MONITORING, ED: Glucose-Capillary: 108 mg/dL — ABNORMAL HIGH (ref 70–99)

## 2022-10-27 MED ORDER — LEVETIRACETAM IN NACL 1000 MG/100ML IV SOLN
1000.0000 mg | Freq: Once | INTRAVENOUS | Status: AC
  Administered 2022-10-27: 1000 mg via INTRAVENOUS
  Filled 2022-10-27: qty 100

## 2022-10-27 MED ORDER — LACOSAMIDE 200 MG PO TABS
200.0000 mg | ORAL_TABLET | Freq: Two times a day (BID) | ORAL | Status: DC
  Administered 2022-10-27 – 2022-10-29 (×4): 200 mg via ORAL
  Filled 2022-10-27: qty 1
  Filled 2022-10-27 (×2): qty 4
  Filled 2022-10-27: qty 1

## 2022-10-27 MED ORDER — SODIUM CHLORIDE 0.9 % IV SOLN
200.0000 mg | Freq: Once | INTRAVENOUS | Status: AC
  Administered 2022-10-27: 200 mg via INTRAVENOUS
  Filled 2022-10-27: qty 20

## 2022-10-27 MED ORDER — ACETAMINOPHEN 325 MG PO TABS: 650.0000 mg | ORAL_TABLET | Freq: Four times a day (QID) | ORAL | Status: DC | PRN

## 2022-10-27 MED ORDER — SENNOSIDES-DOCUSATE SODIUM 8.6-50 MG PO TABS: 1.0000 | ORAL_TABLET | Freq: Every evening | ORAL | Status: DC | PRN

## 2022-10-27 MED ORDER — ZONISAMIDE 100 MG PO CAPS
500.0000 mg | ORAL_CAPSULE | Freq: Every day | ORAL | Status: DC
  Administered 2022-10-27 – 2022-10-28 (×2): 500 mg via ORAL
  Filled 2022-10-27 (×3): qty 5

## 2022-10-27 MED ORDER — ESCITALOPRAM OXALATE 10 MG PO TABS
20.0000 mg | ORAL_TABLET | Freq: Every day | ORAL | Status: DC
  Administered 2022-10-28 – 2022-10-29 (×2): 20 mg via ORAL
  Filled 2022-10-27 (×2): qty 2

## 2022-10-27 MED ORDER — LEVETIRACETAM 500 MG PO TABS: 1000.0000 mg | ORAL_TABLET | Freq: Two times a day (BID) | ORAL | Status: DC

## 2022-10-27 MED ORDER — LACOSAMIDE 50 MG PO TABS: 200.0000 mg | ORAL_TABLET | Freq: Two times a day (BID) | ORAL | Status: DC

## 2022-10-27 MED ORDER — POTASSIUM CHLORIDE 2 MEQ/ML IV SOLN
INTRAVENOUS | Status: DC
  Filled 2022-10-27: qty 1000

## 2022-10-27 MED ORDER — LEVETIRACETAM 500 MG PO TABS
1000.0000 mg | ORAL_TABLET | Freq: Two times a day (BID) | ORAL | Status: DC
  Administered 2022-10-28 – 2022-10-29 (×3): 1000 mg via ORAL
  Filled 2022-10-27 (×3): qty 2

## 2022-10-27 MED ORDER — LORAZEPAM 2 MG/ML IJ SOLN
INTRAMUSCULAR | Status: AC
  Administered 2022-10-27: 2 mg via INTRAVENOUS
  Filled 2022-10-27: qty 1

## 2022-10-27 MED ORDER — ONDANSETRON HCL 4 MG/2ML IJ SOLN
4.0000 mg | Freq: Once | INTRAMUSCULAR | Status: AC
  Administered 2022-10-27: 4 mg via INTRAVENOUS
  Filled 2022-10-27: qty 2

## 2022-10-27 MED ORDER — LORAZEPAM 2 MG/ML IJ SOLN: 2.0000 mg | Freq: Once | INTRAMUSCULAR | Status: AC

## 2022-10-27 MED ORDER — ACETAMINOPHEN 650 MG RE SUPP: 650.0000 mg | Freq: Four times a day (QID) | RECTAL | Status: DC | PRN

## 2022-10-27 MED ORDER — LEVETIRACETAM IN NACL 1000 MG/100ML IV SOLN: 1000.0000 mg | Freq: Once | INTRAVENOUS | Status: DC

## 2022-10-27 MED ORDER — ENOXAPARIN SODIUM 40 MG/0.4ML IJ SOSY
40.0000 mg | PREFILLED_SYRINGE | INTRAMUSCULAR | Status: DC
  Administered 2022-10-28 – 2022-10-29 (×2): 40 mg via SUBCUTANEOUS
  Filled 2022-10-27 (×2): qty 0.4

## 2022-10-27 NOTE — H&P (Signed)
PCP:   Elsie Stain, MD   Chief Complaint:  Seizures  HPI: This is a 42 year old Spanish-speaking only male with past medical history of seizures, TBI, depression.  He was brought to the ER as he has been having seizures the last few days.  He had 3 yesterday, 1 this morning and 1 in the ER.  He is also not been acting himself which causes him to be concerned.  There is no reports of illness, no fevers, chills, shortness of breath, coughing, nausea or vomiting.  Per report the patient has recently stopped taking some of his medications.  Per son who is at bedside this occurs every 3 years or so, when patient becomes a depressed.  His sister was recently diagnosed with cancer is on chemotherapy, this appears to be making him more depressed.  In the ER patient loaded with Keppra.  His witnessed seizure occurred after he was Keppra loaded.  Neurology was consulted, they recommended loading with Vimpat his home medication and stated they would see him.  History provided by son at bedside.   Review of Systems:  Seizures  Past Medical History: Past Medical History:  Diagnosis Date   Depression    Epilepsy ([place])    Pneumothorax 01/28/2021   Seizure ([place])    Seizures ([place])    Traumatic brain injury with loss of consciousness ([place]) 01/22/2021   Past Surgical History:  Procedure Laterality Date   BRAIN SURGERY      Medications: Prior to Admission medications   Medication Sig Start Date End Date Taking? Authorizing Provider  atorvastatin (LIPITOR) 10 MG tablet Take 1 tablet (10 mg total) by mouth daily. 06/25/22   Argentina Donovan, PA-C  escitalopram (LEXAPRO) 20 MG tablet Take 1 tablet (20 mg total) by mouth daily. 06/25/22   Argentina Donovan, PA-C  lacosamide (VIMPAT) 200 MG TABS tablet Take 1 tablet (200 mg total) by mouth 2 (two) times daily. 08/07/22   Cameron Sprang, MD  levETIRAcetam (KEPPRA) 1000 MG tablet Take 1 tablet (1,000 mg total) by mouth 2 (two) times daily. 08/07/22    Cameron Sprang, MD  zonisamide (ZONEGRAN) 100 MG capsule Take 5 capsules by mouth every night. 08/07/22   Cameron Sprang, MD    Allergies:  No Known Allergies  Social History:  reports that he has never smoked. He has never used smokeless tobacco. He reports that he does not currently use alcohol. He reports that he does not currently use drugs.  Family History: Family History  Problem Relation Age of Onset   Seizures Neg Hx     Physical Exam: Vitals:   10/27/22 2015 10/27/22 2030 10/27/22 2115 10/27/22 2239  BP: (!) 130/97 (!) 134/92 (!) 138/92   Pulse: 69 74 90   Resp: '16 17 20   '$ Temp:    98 F (36.7 C)  TempSrc:    Oral  SpO2: 100% 100% 100%   Weight:      Height:        General:  Alert and oriented times one (person), well developed and nourished, in no distress Eyes: PERRLA, pink conjunctiva, no scleral icterus ENT: Moist oral mucosa, neck supple, no thyromegaly Lungs: clear to ascultation, no wheeze, no crackles, no use of accessory muscles Cardiovascular: regular rate and rhythm, no regurgitation, no gallops, no murmurs. No carotid bruits, no JVD Abdomen: soft, positive BS, non-tender, non-distended, no organomegaly, not an acute abdomen GU: not examined Neuro: CN II - XII grossly intact, sensation  intact Musculoskeletal: strength 5/5 all extremities, no clubbing, cyanosis or edema Skin: no rash, no subcutaneous crepitation, no decubitus Psych: Postictal patient   Labs on Admission:  Recent Labs    10/27/22 1839  NA 142  K 3.5  CL 110  CO2 21*  GLUCOSE 107*  BUN 12  CREATININE 1.06  CALCIUM 9.4   Recent Labs    10/27/22 1839  AST 19  ALT 18  ALKPHOS 68  BILITOT 0.5  PROT 8.0  ALBUMIN 4.4    Recent Labs    10/27/22 1839  WBC 8.5  HGB 13.8  HCT 44.4  MCV 87.1  PLT 304     Radiological Exams on Admission: CT Head Wo Contrast  Result Date: 10/27/2022 CLINICAL DATA:  Altered level of consciousness, seizures EXAM: CT HEAD WITHOUT  CONTRAST TECHNIQUE: Contiguous axial images were obtained from the base of the skull through the vertex without intravenous contrast. RADIATION DOSE REDUCTION: This exam was performed according to the departmental dose-optimization program which includes automated exposure control, adjustment of the mA and/or kV according to patient size and/or use of iterative reconstruction technique. COMPARISON:  01/29/2022 FINDINGS: Brain: Stable encephalomalacia within the right frontal lobe. No evidence of acute infarct or hemorrhage. Lateral ventricles are unremarkable. 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 Remaining midline structures are unremarkable. No acute extra-axial fluid collections. No mass effect. Vascular: No hyperdense vessel or unexpected calcification. Skull: Postsurgical changes from prior left frontal craniotomy and right frontal burr hole. No acute bony abnormalities. Sinuses/Orbits: No acute finding. Other: None. IMPRESSION: 1. Stable postsurgical changes. 2. No acute intracranial process. 3. 5 x 6 mm colloid cyst.  No evidence of hydrocephalus. Electronically Signed   By: Randa Ngo M.D.   On: 10/27/2022 22:22   DG Chest Port 1 View  Result Date: 10/27/2022 CLINICAL DATA:  Altered level of consciousness, seizure EXAM: PORTABLE CHEST 1 VIEW COMPARISON:  01/31/2021 FINDINGS: Single frontal view of the chest demonstrates an unremarkable cardiac silhouette. No airspace disease, effusion, or pneumothorax. No acute bony abnormalities. IMPRESSION: 1. No acute intrathoracic process. Electronically Signed   By: Randa Ngo M.D.   On: 10/27/2022 20:27    Assessment/Plan Present on Admission:  Seizures -Loaded with Keppra and Vimpat -Continue home dosage Keppra and Vimpat in a.m. -CT head negative -Neuroconsult placed -Seizure precautions -IV Ativan as needed seizures -CK ordered, rule out rhabdo -IV fluid hydration until CK resulted   Prolonged  postictal state -CT head negative.  No evidence of infection, UA and chest x-ray normal.  Normal WBCs -Vitamin B12, folate, RPR, TSH ordered -Monitor   Depression -Continue Lexapro   Mixed hyperlipidemia -  H/o TBI  Codee Bloodworth 10/27/2022, 10:42 PM

## 2022-10-27 NOTE — ED Provider Triage Note (Signed)
Emergency Medicine Provider Triage Evaluation Note  Omar Robertson , a 42 y.o. male  was evaluated in triage.  Pt complains of multiple status.  Family member notes that patient may have had 3 episodes of seizure-like activity on yesterday.  Patient was complaining of a headache.  At this time family member notes that patient has not been his right mind.  Notes he has had a history of similar symptoms.  Per patient family member, patient has missed doses of his anti-epileptic medication.  Denies chest pain, shortness of breath, abdominal pain, nausea, vomiting, numbness, tingling.  Review of Systems  Positive:  Negative:   Physical Exam  BP (!) 133/93 (BP Location: Right Arm)   Pulse 69   Temp 97.9 F (36.6 C) (Oral)   Resp 16   SpO2 100%  Gen:   Awake, no distress   Resp:  Normal effort  MSK:   Moves extremities without difficulty  Other:  Able to ambulate without assistance or difficulty.  Negative pronator drift.  Grip strength 5/5 bilaterally.  Strength sensation intact to bilateral upper and lower extremities.  Medical Decision Making  Medically screening exam initiated at 6:27 PM.  Appropriate orders placed.  Courtez A Shams Dozois was informed that the remainder of the evaluation will be completed by another provider, this initial triage assessment does not replace that evaluation, and the importance of remaining in the ED until their evaluation is complete.  6:28 PM - Discussed with RN that patient is in need of a room. RN aware and working on room placement.    Zandria Woldt A, PA-C 10/27/22 1828

## 2022-10-27 NOTE — ED Triage Notes (Signed)
Patient brought in by family member for evaluation of altered mental status that started several days ago. Family states patient has missed multiple doses of his antiepileptic medications over the last few days and states he normally becomes confused when he misses doses. Patient is alert and in no apparent distress at this time.

## 2022-10-27 NOTE — ED Provider Notes (Signed)
[facility] Provider Note   CSN: [id number] Arrival date & time: 10/27/22  1817     History  Chief Complaint  Patient presents with   Altered Mental Status    Omar Robertson is a 42 y.o. male hx of seizure with medication uncompliance, here with seizures. Patient admits to medication uncompliance for the last few days. Patient had about 5 seizures for the last few days. He has been confused in between and talks about God frequently.  Patient had previous admission for seizures with prolonged postictal period.   The history is provided by the patient.       Home Medications Prior to Admission medications   Medication Sig Start Date End Date Taking? Authorizing Provider  atorvastatin (LIPITOR) 10 MG tablet Take 1 tablet (10 mg total) by mouth daily. 06/25/22   Argentina Donovan, PA-C  escitalopram (LEXAPRO) 20 MG tablet Take 1 tablet (20 mg total) by mouth daily. 06/25/22   Argentina Donovan, PA-C  lacosamide (VIMPAT) 200 MG TABS tablet Take 1 tablet (200 mg total) by mouth 2 (two) times daily. 08/07/22   Cameron Sprang, MD  levETIRAcetam (KEPPRA) 1000 MG tablet Take 1 tablet (1,000 mg total) by mouth 2 (two) times daily. 08/07/22   Cameron Sprang, MD  zonisamide (ZONEGRAN) 100 MG capsule Take 5 capsules by mouth every night. 08/07/22   Cameron Sprang, MD      Allergies    Patient has no known allergies.    Review of Systems   Review of Systems  Neurological:  Positive for seizures.  All other systems reviewed and are negative.   Physical Exam Updated Vital Signs BP (!) 138/92   Pulse 90   Temp 97.9 F (36.6 C) (Oral)   Resp 20   Ht 5' 9"$  (1.753 m)   Wt 72.7 kg   SpO2 100%   BMI 23.67 kg/m  Physical Exam Vitals and nursing note reviewed.  Constitutional:      Comments: Confused and altered  HENT:     Head: Normocephalic.     Nose: Nose normal.     Mouth/Throat:     Mouth: Mucous membranes are moist.   Eyes:     Extraocular Movements: Extraocular movements intact.     Pupils: Pupils are equal, round, and reactive to light.     Comments: No obvious eye deviation  Cardiovascular:     Rate and Rhythm: Normal rate and regular rhythm.     Pulses: Normal pulses.     Heart sounds: Normal heart sounds.  Pulmonary:     Effort: Pulmonary effort is normal.     Breath sounds: Normal breath sounds.  Abdominal:     General: Abdomen is flat.     Palpations: Abdomen is soft.  Musculoskeletal:        General: Normal range of motion.     Cervical back: Normal range of motion and neck supple.  Skin:    General: Skin is warm.     Capillary Refill: Capillary refill takes less than 2 seconds.  Neurological:     General: No focal deficit present.     Comments: Confused but patient is able to follow commands.  Normal strength and sensation bilateral arms and legs.  No active seizure-like activity  Psychiatric:        Mood and Affect: Mood normal.     ED Results / Procedures / Treatments   Labs (all labs  ordered are listed, but only abnormal results are displayed) Labs Reviewed  COMPREHENSIVE METABOLIC PANEL - Abnormal; Notable for the following components:      Result Value   CO2 21 (*)    Glucose, Bld 107 (*)    All other components within normal limits  URINALYSIS, ROUTINE W REFLEX MICROSCOPIC - Abnormal; Notable for the following components:   Specific Gravity, Urine <1.005 (*)    Hgb urine dipstick TRACE (*)    All other components within normal limits  ACETAMINOPHEN LEVEL - Abnormal; Notable for the following components:   Acetaminophen (Tylenol), Serum <10 (*)    All other components within normal limits  SALICYLATE LEVEL - Abnormal; Notable for the following components:   Salicylate Lvl [id number] (*)    All other components within normal limits  CBG MONITORING, ED - Abnormal; Notable for the following components:   Glucose-Capillary 108 (*)    All other components within normal limits   CBC  RAPID URINE DRUG SCREEN, HOSP PERFORMED  AMMONIA  ETHANOL  URINALYSIS, MICROSCOPIC (REFLEX)    EKG None  Radiology DG Chest Port 1 View  Result Date: 10/27/2022 CLINICAL DATA:  Altered level of consciousness, seizure EXAM: PORTABLE CHEST 1 VIEW COMPARISON:  01/31/2021 FINDINGS: Single frontal view of the chest demonstrates an unremarkable cardiac silhouette. No airspace disease, effusion, or pneumothorax. No acute bony abnormalities. IMPRESSION: 1. No acute intrathoracic process. Electronically Signed   By: Randa Ngo M.D.   On: 10/27/2022 20:27    Procedures Procedures    Medications Ordered in ED Medications  levETIRAcetam (KEPPRA) IVPB 1000 mg/100 mL premix (0 mg Intravenous Stopped 10/27/22 1958)  lacosamide (VIMPAT) 200 mg in sodium chloride 0.9 % 25 mL IVPB (0 mg Intravenous Stopped 10/27/22 2040)  ondansetron (ZOFRAN) injection 4 mg (4 mg Intravenous Given 10/27/22 2021)  LORazepam (ATIVAN) injection 2 mg (2 mg Intravenous Given 10/27/22 2113)    ED Course/ Medical Decision Making/ A&P                             Medical Decision Making Omar Robertson is a 42 y.o. male here presenting with seizure and confusion.  He had 5 seizures in the last 2 days and has not been compliant with his antiepileptics.  Plan to load with IV Keppra and Vimpat and get labs and CT head.  9:20 pm Patient just had another seizure and he was yelling and screaming and stiffened up.  Given 2 mg of Ativan and seizure stopped.  10:30 PM Patient is confused and sedated after Ativan.  Discussed case with neurologist, Dr. Regis Bill.  He will load patient up with more Keppra and also continue Zonegran and he will see patient as a consult.  Recommend medicine admission for multiple seizures likely from medication uncompliance.  CT head unremarkable.  At this point hospitalist to admit for altered mental status after multiple seizures   Problems Addressed: Seizure [facility]): chronic  illness or injury with exacerbation, progression, or side effects of treatment  Amount and/or Complexity of Data Reviewed Labs: ordered. Decision-making details documented in ED Course. Radiology: ordered and independent interpretation performed. Decision-making details documented in ED Course.  Risk Prescription drug management. Decision regarding hospitalization.    Final Clinical Impression(s) / ED Diagnoses Final diagnoses:  None    Rx / DC Orders ED Discharge Orders     None         Drenda Freeze, MD  10/27/22 2240  

## 2022-10-27 NOTE — ED Notes (Signed)
Pt yelling, this RN entered room and found pt with emesis bag to mouth and yelling while blank staring. Pt then started moving both his arms and legs up and down then side to side. MD Darl Householder to bedside during episode.

## 2022-10-28 DIAGNOSIS — R569 Unspecified convulsions: Secondary | ICD-10-CM | POA: Diagnosis not present

## 2022-10-28 LAB — BASIC METABOLIC PANEL
Anion gap: 8 (ref 5–15)
Anion gap: 9 (ref 5–15)
BUN: 10 mg/dL (ref 6–20)
BUN: 11 mg/dL (ref 6–20)
CO2: 18 mmol/L — ABNORMAL LOW (ref 22–32)
CO2: 20 mmol/L — ABNORMAL LOW (ref 22–32)
Calcium: 8.5 mg/dL — ABNORMAL LOW (ref 8.9–10.3)
Calcium: 8.9 mg/dL (ref 8.9–10.3)
Chloride: 110 mmol/L (ref 98–111)
Chloride: 112 mmol/L — ABNORMAL HIGH (ref 98–111)
Creatinine, Ser: 0.86 mg/dL (ref 0.61–1.24)
Creatinine, Ser: 1.06 mg/dL (ref 0.61–1.24)
GFR, Estimated: >60 mL/min (ref >60)
GFR, Estimated: >60 mL/min (ref >60)
Glucose, Bld: 103 mg/dL — ABNORMAL HIGH (ref 70–99)
Glucose, Bld: 114 mg/dL — ABNORMAL HIGH (ref 70–99)
Potassium: 3.3 mmol/L — ABNORMAL LOW (ref 3.5–5.1)
Potassium: 5.7 mmol/L — ABNORMAL HIGH (ref 3.5–5.1)
Sodium: 138 mmol/L (ref 135–145)
Sodium: 139 mmol/L (ref 135–145)

## 2022-10-28 LAB — CBC WITH DIFFERENTIAL/PLATELET
Abs Immature Granulocytes: 0.02 10*3/uL (ref 0.00–0.07)
Basophils Absolute: 0 10*3/uL (ref 0.0–0.1)
Basophils Relative: 0 %
Eosinophils Absolute: 0.1 10*3/uL (ref 0.0–0.5)
Eosinophils Relative: 2 %
HCT: 36.5 % — ABNORMAL LOW (ref 39.0–52.0)
Hemoglobin: 12 g/dL — ABNORMAL LOW (ref 13.0–17.0)
Immature Granulocytes: 0 %
Lymphocytes Relative: 28 %
Lymphs Abs: 2.2 10*3/uL (ref 0.7–4.0)
MCH: 28.6 pg (ref 26.0–34.0)
MCHC: 32.9 g/dL (ref 30.0–36.0)
MCV: 86.9 fL (ref 80.0–100.0)
Monocytes Absolute: 0.9 10*3/uL (ref 0.1–1.0)
Monocytes Relative: 11 %
Neutro Abs: 4.6 10*3/uL (ref 1.7–7.7)
Neutrophils Relative %: 59 %
Platelets: 228 10*3/uL (ref 150–400)
RBC: 4.2 MIL/uL — ABNORMAL LOW (ref 4.22–5.81)
RDW: 13.2 % (ref 11.5–15.5)
WBC: 7.9 10*3/uL (ref 4.0–10.5)
nRBC: 0 % (ref 0.0–0.2)

## 2022-10-28 LAB — CBC
HCT: 39.1 % (ref 39.0–52.0)
Hemoglobin: 12.4 g/dL — ABNORMAL LOW (ref 13.0–17.0)
MCH: 27.2 pg (ref 26.0–34.0)
MCHC: 31.7 g/dL (ref 30.0–36.0)
MCV: 85.7 fL (ref 80.0–100.0)
Platelets: 260 10*3/uL (ref 150–400)
RBC: 4.56 MIL/uL (ref 4.22–5.81)
RDW: 13.3 % (ref 11.5–15.5)
WBC: 7.9 10*3/uL (ref 4.0–10.5)
nRBC: 0 % (ref 0.0–0.2)

## 2022-10-28 LAB — FOLATE: Folate: 19.5 ng/mL (ref >5.9)

## 2022-10-28 LAB — TSH: TSH: 0.928 u[IU]/mL (ref 0.350–4.500)

## 2022-10-28 LAB — RPR: RPR Ser Ql: NONREACTIVE

## 2022-10-28 LAB — VITAMIN B12: Vitamin B-12: 388 pg/mL (ref 180–914)

## 2022-10-28 LAB — CK: Total CK: 57 U/L (ref 49–397)

## 2022-10-28 MED ORDER — ZONISAMIDE 100 MG PO CAPS: 500.0000 mg | ORAL_CAPSULE | Freq: Every day | ORAL | Status: DC

## 2022-10-28 MED ORDER — MELATONIN 5 MG PO TABS
5.0000 mg | ORAL_TABLET | Freq: Once | ORAL | Status: AC
  Administered 2022-10-28: 5 mg via ORAL
  Filled 2022-10-28: qty 1

## 2022-10-28 MED ORDER — LACTATED RINGERS IV SOLN: INTRAVENOUS | Status: DC

## 2022-10-28 MED ORDER — LORAZEPAM 2 MG/ML IJ SOLN
2.0000 mg | Freq: Once | INTRAMUSCULAR | Status: AC
  Administered 2022-10-28: 2 mg via INTRAVENOUS
  Filled 2022-10-28: qty 1

## 2022-10-28 NOTE — Plan of Care (Signed)
  Problem: Activity: Goal: Risk for activity intolerance will decrease Outcome: Progressing   Problem: Pain Managment: Goal: General experience of comfort will improve Outcome: Progressing   Problem: Safety: Goal: Ability to remain free from injury will improve Outcome: Progressing   Problem: Coping: Goal: Ability to adjust to condition or change in health will improve Outcome: Progressing   Problem: Medication: Goal: Risk for medication side effects will decrease Outcome: Progressing   Problem: Safety: Goal: Verbalization of understanding the information provided will improve Outcome: Progressing

## 2022-10-28 NOTE — Plan of Care (Signed)

## 2022-10-28 NOTE — ED Notes (Signed)
ED TO INPATIENT HANDOFF REPORT     S Name/Age/Gender Omar Robertson 42 y.o. male Room/Bed: 045C/045C  Code Status   Code Status: Full Code  Home/SNF/Other Home Patient oriented to: self and time Is this baseline? No   Triage Complete: Triage complete  Chief Complaint Seizures [facility]) [R56.9]  Triage Note Patient brought in by family member for evaluation of altered mental status that started several days ago. Family states patient has missed multiple doses of his antiepileptic medications over the last few days and states he normally becomes confused when he misses doses. Patient is alert and in no apparent distress at this time.   Allergies No Known Allergies  Level of Care/Admitting Diagnosis ED Disposition     ED Disposition  Admit   Condition  --   Comment  Hospital Area: [facility] [[id number]]  Level of Care: Telemetry Medical [104]  May admit patient to [facility] or [facility] if equivalent level of care is available:: No  Covid Evaluation: Confirmed COVID Negative  Diagnosis: Seizures ([place]) [id number]  Admitting Physician: Haywood Pao  Attending Physician: Quintella Baton [id number]  Certification:: I certify this patient will need inpatient services for at least 2 midnights  Estimated Length of Stay: 2          B Medical/Surgery History Past Medical History:  Diagnosis Date   Depression    Epilepsy ([place])    Pneumothorax 01/28/2021   Seizure ([place])    Seizures ([place])    Traumatic brain injury with loss of consciousness ([place]) 01/22/2021   Past Surgical History:  Procedure Laterality Date   BRAIN SURGERY       A IV Location/Drains/Wounds Patient Lines/Drains/Airways Status     Active Line/Drains/Airways     Name Placement date Placement time Site Days   Peripheral IV 10/27/22 20 G Anterior;Proximal;Right Forearm 10/27/22  1942  Forearm  1   Peripheral IV 10/28/22 20 G Anterior;Left Forearm 10/28/22  0120   Forearm  less than 1   Incision (Closed) 01/31/21 Chest Left;Upper 01/31/21  1320  -- 635            Intake/Output Last 24 hours No intake or output data in the 24 hours ending 10/28/22 1457  Labs/Imaging Results for orders placed or performed during the hospital encounter of 10/27/22 (from the past 48 hour(s))  Urinalysis, Routine w reflex microscopic -Urine, Clean Catch     Status: Abnormal   Collection Time: 10/27/22  6:17 PM  Result Value Ref Range   Color, Urine YELLOW YELLOW   APPearance CLEAR CLEAR   Specific Gravity, Urine <1.005 (L) 1.005 - 1.030   pH 6.0 5.0 - 8.0   Glucose, UA NEGATIVE NEGATIVE mg/dL   Hgb urine dipstick TRACE (A) NEGATIVE   Bilirubin Urine NEGATIVE NEGATIVE   Ketones, ur NEGATIVE NEGATIVE mg/dL   Protein, ur NEGATIVE NEGATIVE mg/dL   Nitrite NEGATIVE NEGATIVE   Leukocytes,Ua NEGATIVE NEGATIVE    Comment: Performed at [facility] [address], [place], [place] [address]  Urine rapid drug screen (hosp performed)     Status: None   Collection Time: 10/27/22  6:17 PM  Result Value Ref Range   Opiates NONE DETECTED NONE DETECTED   Cocaine NONE DETECTED NONE DETECTED   Benzodiazepines NONE DETECTED NONE DETECTED   Amphetamines NONE DETECTED NONE DETECTED   Tetrahydrocannabinol NONE DETECTED NONE DETECTED   Barbiturates NONE DETECTED NONE DETECTED    Comment: (NOTE) [place] [facility]  PURPOSES ONLY.  IF CONFIRMATION IS NEEDED FOR ANY PURPOSE, NOTIFY LAB WITHIN 5 DAYS.  LOWEST DETECTABLE LIMITS FOR URINE DRUG SCREEN Drug Class                     Cutoff (ng/mL) Amphetamine and metabolites    1000 Barbiturate and metabolites    200 Benzodiazepine                 200 Opiates and metabolites        300 Cocaine and metabolites        300 THC                            50 Performed at [facility], [place] [address] [place], [place] [address]   Urinalysis, Microscopic (reflex)     Status: None   Collection Time: 10/27/22   6:17 PM  Result Value Ref Range   RBC / HPF 0-5 0 - 5 RBC/hpf   WBC, UA 0-5 0 - 5 WBC/hpf   Bacteria, UA NONE SEEN NONE SEEN   Squamous Epithelial / HPF 0-5 0 - 5 /HPF   Mucus PRESENT     Comment: Performed at [facility], [place] [address], [place], [place] [address]  Ethanol     Status: None   Collection Time: 10/27/22  6:37 PM  Result Value Ref Range   Alcohol, Ethyl (B) <10 <10 mg/dL    Comment: (NOTE) Lowest detectable limit for serum alcohol is 10 mg/dL.  For medical purposes only. Performed at [facility], [place] [address], [place], [place] [address]   Acetaminophen level     Status: Abnormal   Collection Time: 10/27/22  6:37 PM  Result Value Ref Range   Acetaminophen (Tylenol), Serum <10 (L) 10 - 30 ug/mL    Comment: (NOTE) Therapeutic concentrations vary significantly. A range of 10-30 ug/mL  may be an effective concentration for many patients. However, some  are best treated at concentrations outside of this range. Acetaminophen concentrations >150 ug/mL at 4 hours after ingestion  and >50 ug/mL at 12 hours after ingestion are often associated with  toxic reactions.  Performed at [facility], [place] [address] [place], [place] [id number]   Salicylate level     Status: Abnormal   Collection Time: 10/27/22  6:37 PM  Result Value Ref Range   Salicylate Lvl [id number] (L) 7.0 - 30.0 mg/dL    Comment: Performed at [facility] [address], [place], [place] [address]  Comprehensive metabolic panel     Status: Abnormal   Collection Time: 10/27/22  6:39 PM  Result Value Ref Range   Sodium 142 135 - 145 mmol/L   Potassium 3.5 3.5 - 5.1 mmol/L   Chloride 110 98 - 111 mmol/L   CO2 21 (L) 22 - 32 mmol/L   Glucose, Bld 107 (H) 70 - 99 mg/dL    Comment: Glucose reference range applies only to samples taken after fasting for at least 8 hours.   BUN 12 6 - 20 mg/dL   Creatinine, Ser 1.06 0.61 - 1.24 mg/dL   Calcium 9.4 8.9 - 10.3 mg/dL   Total Protein 8.0  6.5 - 8.1 g/dL   Albumin 4.4 3.5 - 5.0 g/dL   AST 19 15 - 41 U/L   ALT 18 0 - 44 U/L   Alkaline Phosphatase 68 38 - 126 U/L  Total Bilirubin 0.5 0.3 - 1.2 mg/dL   GFR, Estimated >60 >60 mL/min    Comment: (NOTE) Calculated using the CKD-EPI Creatinine Equation (2021)    Anion gap 11 5 - 15    Comment: Performed at [facility] [address], [place] [address]  CBC     Status: None   Collection Time: 10/27/22  6:39 PM  Result Value Ref Range   WBC 8.5 4.0 - 10.5 K/uL   RBC 5.10 4.22 - 5.81 MIL/uL   Hemoglobin 13.8 13.0 - 17.0 g/dL   HCT 44.4 39.0 - 52.0 %   MCV 87.1 80.0 - 100.0 fL   MCH 27.1 26.0 - 34.0 pg   MCHC 31.1 30.0 - 36.0 g/dL   RDW 13.2 11.5 - 15.5 %   Platelets 304 150 - 400 K/uL   nRBC 0.0 0.0 - 0.2 %    Comment: Performed at [facility], [place] [address] [place], [place] [address]  Ammonia     Status: None   Collection Time: 10/27/22  6:39 PM  Result Value Ref Range   Ammonia 14 9 - 35 umol/L    Comment: Performed at [facility], [place] [address] [place], [place] [address]  CBG monitoring, ED     Status: Abnormal   Collection Time: 10/27/22  8:16 PM  Result Value Ref Range   Glucose-Capillary 108 (H) 70 - 99 mg/dL    Comment: Glucose reference range applies only to samples taken after fasting for at least 8 hours.  Basic metabolic panel     Status: Abnormal   Collection Time: 10/28/22 12:20 AM  Result Value Ref Range   Sodium 138 135 - 145 mmol/L   Potassium 5.7 (H) 3.5 - 5.1 mmol/L   Chloride 112 (H) 98 - 111 mmol/L   CO2 18 (L) 22 - 32 mmol/L   Glucose, Bld 103 (H) 70 - 99 mg/dL    Comment: Glucose reference range applies only to samples taken after fasting for at least 8 hours.   BUN 10 6 - 20 mg/dL   Creatinine, Ser 0.86 0.61 - 1.24 mg/dL   Calcium 8.5 (L) 8.9 - 10.3 mg/dL   GFR, Estimated >60 >60 mL/min    Comment: (NOTE) Calculated using the CKD-EPI Creatinine Equation (2021)    Anion gap 8 5 - 15    Comment:  Performed at [facility] [address] [place], [place] [address]  CBC with Differential/Platelet     Status: Abnormal   Collection Time: 10/28/22 12:20 AM  Result Value Ref Range   WBC 7.9 4.0 - 10.5 K/uL   RBC 4.20 (L) 4.22 - 5.81 MIL/uL   Hemoglobin 12.0 (L) 13.0 - 17.0 g/dL   HCT 36.5 (L) 39.0 - 52.0 %   MCV 86.9 80.0 - 100.0 fL   MCH 28.6 26.0 - 34.0 pg   MCHC 32.9 30.0 - 36.0 g/dL   RDW 13.2 11.5 - 15.5 %   Platelets 228 150 - 400 K/uL   nRBC 0.0 0.0 - 0.2 %   Neutrophils Relative % 59 %   Neutro Abs 4.6 1.7 - 7.7 K/uL   Lymphocytes Relative 28 %   Lymphs Abs 2.2 0.7 - 4.0 K/uL   Monocytes Relative 11 %   Monocytes Absolute 0.9 0.1 - 1.0 K/uL   Eosinophils Relative 2 %   Eosinophils Absolute 0.1 0.0 - 0.5 K/uL   Basophils Relative 0 %   Basophils  Absolute 0.0 0.0 - 0.1 K/uL   Immature Granulocytes 0 %   Abs Immature Granulocytes 0.02 0.00 - 0.07 K/uL    Comment: Performed at [facility], [place] [address], [place], [place] [address]  TSH     Status: None   Collection Time: 10/28/22 12:20 AM  Result Value Ref Range   TSH 0.928 0.350 - 4.500 uIU/mL    Comment: Performed by a 3rd Generation assay with a functional sensitivity of <=0.01 uIU/mL. Performed at [facility], [place] [address], [place], [place] [address]   Vitamin B12     Status: None   Collection Time: 10/28/22 12:20 AM  Result Value Ref Range   Vitamin B-12 388 180 - 914 pg/mL    Comment: (NOTE) This assay is not validated for testing neonatal or myeloproliferative syndrome specimens for Vitamin B12 levels. Performed at [facility], [place] [address] [place], [place] [address]   Folate     Status: None   Collection Time: 10/28/22 12:20 AM  Result Value Ref Range   Folate 19.5 >5.9 ng/mL    Comment: Performed at [facility] [address], [place], [place] [address]  CBC     Status: Abnormal   Collection Time: 10/28/22  1:50 AM  Result Value Ref Range   WBC 7.9 4.0 -  10.5 K/uL   RBC 4.56 4.22 - 5.81 MIL/uL   Hemoglobin 12.4 (L) 13.0 - 17.0 g/dL   HCT 39.1 39.0 - 52.0 %   MCV 85.7 80.0 - 100.0 fL   MCH 27.2 26.0 - 34.0 pg   MCHC 31.7 30.0 - 36.0 g/dL   RDW 13.3 11.5 - 15.5 %   Platelets 260 150 - 400 K/uL   nRBC 0.0 0.0 - 0.2 %    Comment: Performed at [facility], [place] [address] [place], [place] [address]  CK     Status: None   Collection Time: 10/28/22  1:50 AM  Result Value Ref Range   Total CK 57 49 - 397 U/L    Comment: Performed at [facility], [place] [address] [place], [place] [address]  RPR     Status: None   Collection Time: 10/28/22  1:50 AM  Result Value Ref Range   RPR Ser Ql NON REACTIVE NON REACTIVE    Comment: Performed at [facility], [place] [address] [place], [place] [id number]  Basic metabolic panel     Status: Abnormal   Collection Time: 10/28/22  1:50 AM  Result Value Ref Range   Sodium 139 135 - 145 mmol/L   Potassium 3.3 (L) 3.5 - 5.1 mmol/L   Chloride 110 98 - 111 mmol/L   CO2 20 (L) 22 - 32 mmol/L   Glucose, Bld 114 (H) 70 - 99 mg/dL    Comment: Glucose reference range applies only to samples taken after fasting for at least 8 hours.   BUN 11 6 - 20 mg/dL   Creatinine, Ser 1.06 0.61 - 1.24 mg/dL   Calcium 8.9 8.9 - 10.3 mg/dL   GFR, Estimated >60 >60 mL/min    Comment: (NOTE) Calculated using the CKD-EPI Creatinine Equation (2021)    Anion gap 9 5 - 15    Comment: Performed at [facility] [address], [place], [place] [address]   CT Head Wo Contrast  Result Date: 10/27/2022 CLINICAL DATA:  Altered level of consciousness, seizures EXAM: CT HEAD WITHOUT CONTRAST TECHNIQUE:  Contiguous axial images were obtained from the base of the skull through the vertex without intravenous contrast. RADIATION DOSE REDUCTION: This exam was performed according to the departmental dose-optimization program which includes automated exposure control, adjustment of the mA and/or kV according to patient size  and/or use of iterative reconstruction technique. COMPARISON:  01/29/2022 FINDINGS: Brain: Stable encephalomalacia within the right frontal lobe. No evidence of acute infarct or hemorrhage. Lateral ventricles are unremarkable. 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 Remaining midline structures are unremarkable. No acute extra-axial fluid collections. No mass effect. Vascular: No hyperdense vessel or unexpected calcification. Skull: Postsurgical changes from prior left frontal craniotomy and right frontal burr hole. No acute bony abnormalities. Sinuses/Orbits: No acute finding. Other: None. IMPRESSION: 1. Stable postsurgical changes. 2. No acute intracranial process. 3. 5 x 6 mm colloid cyst.  No evidence of hydrocephalus. Electronically Signed   By: Randa Ngo M.D.   On: 10/27/2022 22:22   DG Chest Port 1 View  Result Date: 10/27/2022 CLINICAL DATA:  Altered level of consciousness, seizure EXAM: PORTABLE CHEST 1 VIEW COMPARISON:  01/31/2021 FINDINGS: Single frontal view of the chest demonstrates an unremarkable cardiac silhouette. No airspace disease, effusion, or pneumothorax. No acute bony abnormalities. IMPRESSION: 1. No acute intrathoracic process. Electronically Signed   By: Randa Ngo M.D.   On: 10/27/2022 20:27    Pending Labs Unresulted Labs (From admission, onward)     Start     Ordered   11/03/22 0500  Creatinine, serum  (enoxaparin (LOVENOX)    CrCl >/= 30 ml/min)  Weekly,   R     Comments: while on enoxaparin therapy    10/27/22 2353   10/29/22 0500  CBC  Daily,   R      10/28/22 1444   10/29/22 [id number]  Basic metabolic panel  Daily,   R      10/28/22 1444            Vitals/Pain Today's Vitals   10/28/22 1410 10/28/22 1415 10/28/22 1430 10/28/22 1445  BP:  107/86 110/79 111/79  Pulse:  (!) 53 (!) 59 (!) 57  Resp:  '14 16 15  '$ Temp: 98.5 F (36.9 C)     TempSrc: Oral     SpO2:  99% 100% 99%  Weight:      Height:       PainSc:        Isolation Precautions No active isolations  Medications Medications  zonisamide (ZONEGRAN) capsule 500 mg (500 mg Oral Given 10/27/22 2251)  lacosamide (VIMPAT) tablet 200 mg (200 mg Oral Given 10/28/22 0916)  levETIRAcetam (KEPPRA) tablet 1,000 mg (1,000 mg Oral Given 10/28/22 0915)  escitalopram (LEXAPRO) tablet 20 mg (20 mg Oral Given 10/28/22 0916)  enoxaparin (LOVENOX) injection 40 mg (40 mg Subcutaneous Given 10/28/22 0918)  acetaminophen (TYLENOL) tablet 650 mg (has no administration in time range)    Or  acetaminophen (TYLENOL) suppository 650 mg (has no administration in time range)  senna-docusate (Senokot-S) tablet 1 tablet (has no administration in time range)  lactated ringers infusion ( Intravenous Rate/Dose Verify 10/28/22 1028)  levETIRAcetam (KEPPRA) IVPB 1000 mg/100 mL premix (0 mg Intravenous Stopped 10/27/22 1958)  lacosamide (VIMPAT) 200 mg in sodium chloride 0.9 % 25 mL IVPB (0 mg Intravenous Stopped 10/27/22 2040)  ondansetron (ZOFRAN) injection 4 mg (4 mg Intravenous Given 10/27/22 2021)  LORazepam (ATIVAN) injection 2 mg (2 mg Intravenous Given 10/27/22 2113)  levETIRAcetam (KEPPRA) IVPB 1000 mg/100  mL premix (0 mg Intravenous Stopped 10/27/22 2252)  melatonin tablet 5 mg (5 mg Oral Given 10/28/22 0159)  LORazepam (ATIVAN) injection 2 mg (2 mg Intravenous Given 10/28/22 0321)    Mobility walks     Focused Assessments Neuro Assessment Handoff:  Swallow screen pass? Yes          Neuro Assessment: Exceptions to [facility] Neuro Checks:      Has TPA been given? No If patient is a Neuro Trauma and patient is going to OR before floor call report to [facility] nurse: [phone] or [phone]   R Recommendations: See Admitting Provider Note  Report given to:

## 2022-10-28 NOTE — Progress Notes (Signed)
PROGRESS NOTE    Omar Robertson  [id number] DOB: 1980-10-18 DOA: 10/27/2022 PCP: Elsie Stain, MD   Brief Narrative:  This is a 42 year old Spanish-speaking only male with past medical history of recurrent  ?intractable seizures (follows with neurology and is known to be noncompliant with medications), resected brain cyst, depression.   Patient presents to the hospital again for worsening mental status noted seizures at home 3 the day prior to admission, 1 the morning of admission and 1 in the emergency department.  Neurology consulted and hospitalist called for admission.   Assessment & Plan:   Principal Problem:   Seizures ([place]) Active Problems:   Epilepsy ([place])   Mixed hyperlipidemia   TBI (traumatic brain injury) ([place])   Depression   Acute breakthrough seizure in the setting of known noncompliance -Patient remains altered during interview this morning, family confirms patient has been noncompliant with medications over the past few days due to family stressors -Loaded with Keppra, Vimpat in the ED -Neurology following, appreciate insight recommendations -Continue Keppra 1000 mg twice daily, Vimpat 200 mg twice daily and Zonegran 500 mg at bedtime -Imaging negative thus far -Patient no longer hallucinating per family which is his baseline after clusters of seizure. -Overt triggers for seizure are negative, CT head, infectious disease workup, polypharmacy or withdrawal all negative   Acute metabolic encephalopathy Postictal psychosis, recurrent -Likely secondary to prolonged postictal state and recurrent seizures -Imaging negative, infectious workup negative as above -Labs essentially within normal limits other than mild hyper/hypokalemia and anemia   Profound medication noncompliance  -As above family confirms patient skips medications quite frequently, likely the etiology of his recurrent hospitalizations for seizures mental status changes and  postictal psychosis.  Hyperkalemia -Likely hemoconcentration, no hypokalemic, mild, follow repeat labs  Depression -Continue Lexapro   Mixed hyperlipidemia H/o colloid brain cyst excision and TBI -No medication changes or further workup indicated  DVT prophylaxis: Lovenox Code Status: Full Family Communication: Family at bedside, wife and son  Status is: Inpatient  Dispo: The patient is from: Home              Anticipated d/c is to: Home              Anticipated d/c date is: 24 to 48 hours pending clinical course and resolution of altered mental status/encephalopathy              Patient currently not medically stable for discharge at this time given ongoing encephalopathy somnolence and need for ongoing monitoring and workup with neurology to ensure no repeat recurrent or breakthrough seizures  Consultants:  Neurology  Procedures:  None  Antimicrobials:  None indicated  Subjective: No acute issues or events this morning, patient remains somnolent, review of systems markedly limited, he is easily arousable but falls asleep quickly.  Family at bedside state he appears to be at baseline resting comfortably.  Objective: Vitals:   10/28/22 0200 10/28/22 0252 10/28/22 0500 10/28/22 0600  BP: 124/78  98/62 90/65  Pulse: 75   64  Resp: (!) '22  16 15  '$ Temp:  97.9 F (36.6 C)  98.1 F (36.7 C)  TempSrc:  Oral  Oral  SpO2: 99%   99%  Weight:      Height:       No intake or output data in the 24 hours ending 10/28/22 0729 Filed Weights   10/27/22 1944  Weight: 72.7 kg    Examination:  General:  Pleasantly resting in bed, No acute  distress. HEENT:  Normocephalic atraumatic.  Sclerae nonicteric, noninjected.  Extraocular movements intact bilaterally. Neck:  Without mass or deformity.  Trachea is midline. Lungs:  Clear to auscultate bilaterally without rhonchi, wheeze, or rales. Heart:  Regular rate and rhythm.  Without murmurs, rubs, or gallops. Abdomen:  Soft,  nontender, nondistended.  Without guarding or rebound. Extremities: Without cyanosis, clubbing, edema, or obvious deformity. Vascular:  Dorsalis pedis and posterior tibial pulses palpable bilaterally. Skin:  Warm and dry, no erythema, no ulcerations.   Data Reviewed: I have personally reviewed following labs and imaging studies  CBC: Recent Labs  Lab 10/27/22 1839 10/28/22 0020 10/28/22 0150  WBC 8.5 7.9 7.9  NEUTROABS  --  4.6  --   HGB 13.8 12.0* 12.4*  HCT 44.4 36.5* 39.1  MCV 87.1 86.9 85.7  PLT 304 228 [id number]   Basic Metabolic Panel: Recent Labs  Lab 10/27/22 1839 10/28/22 0020 10/28/22 0150  NA 142 138 139  K 3.5 5.7* 3.3*  CL 110 112* 110  CO2 21* 18* 20*  GLUCOSE 107* 103* 114*  BUN '12 10 11  '$ CREATININE 1.06 0.86 1.06  CALCIUM 9.4 8.5* 8.9   GFR: Estimated Creatinine Clearance: 89.9 mL/min (by C-G formula based on SCr of 1.06 mg/dL). Liver Function Tests: Recent Labs  Lab 10/27/22 1839  AST 19  ALT 18  ALKPHOS 68  BILITOT 0.5  PROT 8.0  ALBUMIN 4.4   No results for input(s): "LIPASE", "AMYLASE" in the last 168 hours. Recent Labs  Lab 10/27/22 1839  AMMONIA 14   Coagulation Profile: No results for input(s): "INR", "PROTIME" in the last 168 hours. Cardiac Enzymes: Recent Labs  Lab 10/28/22 0150  CKTOTAL 57   BNP (last 3 results) No results for input(s): "PROBNP" in the last 8760 hours. HbA1C: No results for input(s): "HGBA1C" in the last 72 hours. CBG: Recent Labs  Lab 10/27/22 2016  GLUCAP 108*   Lipid Profile: No results for input(s): "CHOL", "HDL", "LDLCALC", "TRIG", "CHOLHDL", "LDLDIRECT" in the last 72 hours. Thyroid Function Tests: Recent Labs    10/28/22 0020  TSH 0.928   Anemia Panel: Recent Labs    10/28/22 0020  VITAMINB12 388  FOLATE 19.5   Sepsis Labs: No results for input(s): "PROCALCITON", "LATICACIDVEN" in the last 168 hours.  No results found for this or any previous visit (from the past 240 hour(s)).        Radiology Studies: CT Head Wo Contrast  Result Date: 10/27/2022 CLINICAL DATA:  Altered level of consciousness, seizures EXAM: CT HEAD WITHOUT CONTRAST TECHNIQUE: Contiguous axial images were obtained from the base of the skull through the vertex without intravenous contrast. RADIATION DOSE REDUCTION: This exam was performed according to the departmental dose-optimization program which includes automated exposure control, adjustment of the mA and/or kV according to patient size and/or use of iterative reconstruction technique. COMPARISON:  01/29/2022 FINDINGS: Brain: Stable encephalomalacia within the right frontal lobe. No evidence of acute infarct or hemorrhage. Lateral ventricles are unremarkable. 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 Remaining midline structures are unremarkable. No acute extra-axial fluid collections. No mass effect. Vascular: No hyperdense vessel or unexpected calcification. Skull: Postsurgical changes from prior left frontal craniotomy and right frontal burr hole. No acute bony abnormalities. Sinuses/Orbits: No acute finding. Other: None. IMPRESSION: 1. Stable postsurgical changes. 2. No acute intracranial process. 3. 5 x 6 mm colloid cyst.  No evidence of hydrocephalus. Electronically Signed   By: Diana Eves.D.  On: 10/27/2022 22:22   DG Chest Port 1 View  Result Date: 10/27/2022 CLINICAL DATA:  Altered level of consciousness, seizure EXAM: PORTABLE CHEST 1 VIEW COMPARISON:  01/31/2021 FINDINGS: Single frontal view of the chest demonstrates an unremarkable cardiac silhouette. No airspace disease, effusion, or pneumothorax. No acute bony abnormalities. IMPRESSION: 1. No acute intrathoracic process. Electronically Signed   By: Randa Ngo M.D.   On: 10/27/2022 20:27    Scheduled Meds:  enoxaparin (LOVENOX) injection  40 mg Subcutaneous Q24H   escitalopram  20 mg Oral Daily   lacosamide  200 mg Oral BID    levETIRAcetam  1,000 mg Oral BID   zonisamide  500 mg Oral QHS   Continuous Infusions:  lactated ringers 125 mL/hr at 10/28/22 0115     LOS: 1 day   Time spent: 16mn  Zaliah Wissner C Quyen Cutsforth, DO [facility]  If 7PM-7AM, please contact night-coverage [email or web address]  10/28/2022, 7:29 AM

## 2022-10-28 NOTE — Consult Note (Signed)
NEUROLOGY CONSULTATION NOTE   Date of service: October 28, 2022 Patient Name: Omar Robertson MRN:  [id number] DOB:  05-Oct-1980 Reason for consult: "seizures" Requesting Provider: Quintella Baton, MD _ _ _   _ __   _ __ _ _  __ __   _ __   __ _  History of Present Illness  Omar Robertson is a 42 y.o. male with PMH significant for depression, epilepsy on Keppra, Vimpat, Zonegran, hx of TBI, who is brought in by family for multiple seizures over the last few days in the setting of non compliance with meds.  Son at bedside reports typically, patient has 1 seizure a month. Last week, learned of a family member diagnosed with cancer. Patient is a little stressed. Stopped taking seizure meds on Friday. Had 2 seizures on Saturday. Took all of his meds on Saturday and most on Sunday. Had 3 more seizures on Sunday. Had seizures on Monday.  About every 2 years, patient would randomly stop taking his seizure meds. Currently, he is hallucinating. He has not slept in 2 days. Son reports that he gets post ictal psychosis after clustering of seizures. He has done this in the past. He gets agitated. Son reports that patient is seeing dead family member on the phone or in the room.  He had a witnessed seizure in the ED. Started with vocalization, blank stare, followed by tonic clonic seizure. Given 2 mg of ativan with resolution.  At baseline, patient has some problems with short term memory loss. This is worse after a seizure. No new meds, does not drink alcohol.   ROS   Constitutional Denies weight loss, fever and chills.   HEENT Denies changes in vision and hearing.   Respiratory Denies SOB and cough.   CV Denies palpitations and CP   GI Denies abdominal pain, nausea, vomiting and diarrhea.   GU Denies dysuria and urinary frequency.   MSK Denies myalgia and joint pain.   Skin Denies rash and pruritus.   Neurological Denies headache and syncope.   Psychiatric Denies recent  changes in mood. Denies anxiety and depression.    Past History   Past Medical History:  Diagnosis Date   Depression    Epilepsy ([place])    Pneumothorax 01/28/2021   Seizure ([place])    Seizures ([place])    Traumatic brain injury with loss of consciousness ([place]) 01/22/2021   Past Surgical History:  Procedure Laterality Date   BRAIN SURGERY     Family History  Problem Relation Age of Onset   Seizures Neg Hx    Social History   Socioeconomic History   Marital status: Soil scientist    Spouse name: Not on file   Number of children: Not on file   Years of education: Not on file   Highest education level: Not on file  Occupational History   Not on file  Tobacco Use   Smoking status: Never   Smokeless tobacco: Never  Vaping Use   Vaping Use: Never used  Substance and Sexual Activity   Alcohol use: Not Currently   Drug use: Not Currently   Sexual activity: Not Currently  Other Topics Concern   Not on file  Social History Narrative   Right handed    Drinks no caffeine    Social Determinants of Health   Financial Resource Strain: Not on file  Food Insecurity: Not on file  Transportation Needs: Not on file  Physical Activity: Not on file  Stress: Not on file  Social Connections: Not on file   No Known Allergies  Medications  (Not in a hospital admission)    Vitals   Vitals:   10/27/22 2115 10/27/22 2239 10/28/22 0100 10/28/22 0200  BP: (!) 138/92  119/78 124/78  Pulse: 90  81 75  Resp: 20  19 (!) 22  Temp:  98 F (36.7 C)    TempSrc:  Oral    SpO2: 100%  95% 99%  Weight:      Height:         Body mass index is 23.67 kg/m.  Physical Exam   General: Laying comfortably in bed; in no acute distress.  HENT: Normal oropharynx and mucosa. Normal external appearance of ears and nose.  Neck: Supple, no pain or tenderness  CV: No JVD. No peripheral edema.  Pulmonary: Symmetric Chest rise. Normal respiratory effort.  Abdomen: Soft to touch, non-tender.  Ext:  No cyanosis, edema, or deformity  Skin: No rash. Normal palpation of skin.   Musculoskeletal: Normal digits and nails by inspection. No clubbing.   Neurologic Examination  Mental status/Cognition: Alert, oriented to self, place, month and year, good attention.  Speech/language: Fluent, comprehension intact, object naming intact, repetition intact.  Cranial nerves:   CN II Pupils equal and reactive to light, no VF deficits    CN III,IV,VI EOM intact, no gaze preference or deviation, no nystagmus    CN V normal sensation in V1, V2, and V3 segments bilaterally    CN VII no asymmetry, no nasolabial fold flattening    CN VIII normal hearing to speech    CN IX & X normal palatal elevation, no uvular deviation    CN XI 5/5 head turn and 5/5 shoulder shrug bilaterally    CN XII midline tongue protrusion    Motor:  Muscle bulk: normal, tone normal, pronator drift none tremor none Mvmt Root Nerve  Muscle Right Left Comments  SA C5/6 Ax Deltoid 5 5   EF C5/6 Mc Biceps 5 5   EE C6/7/8 Rad Triceps 5 5   WF C6/7 Med FCR     WE C7/8 PIN ECU     F Ab C8/T1 U ADM/FDI 5 5   HF L1/2/3 Fem Illopsoas 5 5   KE L2/3/4 Fem Quad 5 5   DF L4/5 D Peron Tib Ant 5 5   PF S1/2 Tibial Grc/Sol 5 5    Sensation:  Light touch Intact throughout   Pin prick    Temperature    Vibration   Proprioception    Coordination/Complex Motor:  - Finger to Nose intact BL - Heel to shin intact BL - Rapid alternating movement are normal - Gait: deferred.  Labs   CBC:  Recent Labs  Lab 10/27/22 1839 10/28/22 0020  WBC 8.5 7.9  NEUTROABS  --  4.6  HGB 13.8 12.0*  HCT 44.4 36.5*  MCV 87.1 86.9  PLT 304 [id number]    Basic Metabolic Panel:  Lab Results  Component Value Date   NA 138 10/28/2022   K 5.7 (H) 10/28/2022   CO2 18 (L) 10/28/2022   GLUCOSE 103 (H) 10/28/2022   BUN 10 10/28/2022   CREATININE 0.86 10/28/2022   CALCIUM 8.5 (L) 10/28/2022   GFRNONAA >60 10/28/2022   Lipid Panel:  Lab Results   Component Value Date   LDLCALC 103 (H) 06/25/2022   HgbA1c:  Lab Results  Component Value Date   HGBA1C 5.4 01/23/2022   Urine Drug  Screen:     Component Value Date/Time   LABOPIA NONE DETECTED 10/27/2022 1817   COCAINSCRNUR NONE DETECTED 10/27/2022 1817   LABBENZ NONE DETECTED 10/27/2022 1817   AMPHETMU NONE DETECTED 10/27/2022 1817   THCU NONE DETECTED 10/27/2022 1817   LABBARB NONE DETECTED 10/27/2022 1817    Alcohol Level     Component Value Date/Time   ETH <10 10/27/2022 1837    CT Head without contrast(Personally reviewed): CTH was negative for a large hypodensity concerning for a large territory infarct or hyperdensity concerning for an [place]   Impression   Vertis A Delaine Sauber is a 42 y.o. male with PMH significant for depression, epilepsy on Keppra, Vimpat, Zonegran, hx of TBI, who is brought in by family for multiple seizures over the last few days in the setting of non compliance with meds.  Etiology of his seizures is likely medication non compliance and sleep deprivation. No reported signs of infection. Vitals stable.  Recommendations  - Ativan '2mg'$  Iv once as a sleep aid. - continue keppra '1000mg'$  BID, Vimpat '200mg'$  BID, Zonegran '500mg'$  QHS. - Additional 1 time dose of Keppra '2000mg'$  IV tonight. - Observe overnight for seizure clustering. ______________________________________________________________________   Thank you for the opportunity to take part in the care of this patient. If you have any further questions, please contact the neurology consultation attending.  Signed,  [facility] Pager Number [phone] _ _ _   _ __   _ __ _ _  __ __   _ __   __ _

## 2022-10-28 NOTE — ED Notes (Signed)
Pts family states that the pt has not slept in 2 days and he is now seeing things crawling on the wall and wanting to leave, attempting to remove monitoring equipment. Informed pt that it is not safe for him to leave. Pt sitting in bed at this time. MD Crosley made aware.

## 2022-10-29 DIAGNOSIS — R569 Unspecified convulsions: Secondary | ICD-10-CM | POA: Diagnosis not present

## 2022-10-29 LAB — BASIC METABOLIC PANEL
Anion gap: 11 (ref 5–15)
BUN: 12 mg/dL (ref 6–20)
CO2: 21 mmol/L — ABNORMAL LOW (ref 22–32)
Calcium: 9 mg/dL (ref 8.9–10.3)
Chloride: 109 mmol/L (ref 98–111)
Creatinine, Ser: 1.1 mg/dL (ref 0.61–1.24)
GFR, Estimated: >60 mL/min (ref >60)
Glucose, Bld: 92 mg/dL (ref 70–99)
Potassium: 3.2 mmol/L — ABNORMAL LOW (ref 3.5–5.1)
Sodium: 141 mmol/L (ref 135–145)

## 2022-10-29 LAB — CBC
HCT: 36.2 % — ABNORMAL LOW (ref 39.0–52.0)
Hemoglobin: 11.9 g/dL — ABNORMAL LOW (ref 13.0–17.0)
MCH: 27.8 pg (ref 26.0–34.0)
MCHC: 32.9 g/dL (ref 30.0–36.0)
MCV: 84.6 fL (ref 80.0–100.0)
Platelets: 233 10*3/uL (ref 150–400)
RBC: 4.28 MIL/uL (ref 4.22–5.81)
RDW: 13.2 % (ref 11.5–15.5)
WBC: 8 10*3/uL (ref 4.0–10.5)
nRBC: 0 % (ref 0.0–0.2)

## 2022-10-29 MED ORDER — POTASSIUM CHLORIDE CRYS ER 20 MEQ PO TBCR
40.0000 meq | EXTENDED_RELEASE_TABLET | Freq: Once | ORAL | Status: AC
  Administered 2022-10-29: 40 meq via ORAL
  Filled 2022-10-29: qty 2

## 2022-10-29 NOTE — Progress Notes (Signed)
Mobility Specialist: Progress Note   10/29/22 1516  Mobility  Activity Ambulated independently in hallway  Level of Assistance Independent  Assistive Device None  Distance Ambulated (ft) 350 ft  Activity Response Tolerated well  Mobility Referral Yes  $Mobility charge 1 Mobility   Pre-Mobility: 78 HR, 98% SpO2 Post-Mobility: 83 R, 9% SpO2  Received pt in bed having no complaints and agreeable to mobility. Pt was asymptomatic throughout ambulation and returned to room w/o fault. Left sitting EOB w/ call bell in reach and all needs met.  [place] Gioia Ranes Mobility Specialist Please contact via SecureChat or Rehab office at [phone]

## 2022-10-29 NOTE — TOC Transition Note (Signed)
Transition of Care [facility]) - CM/SW Discharge Note   Patient Details  Name: Omar Robertson MRN: [id number] Date of Birth: 1981-05-03  [facility] CM/SW Contact:  Pollie Friar, RN Phone Number: 10/29/2022, 1:53 PM   Clinical Narrative:    CM used interpreter services for visit.  CM met with the patient and his brother at the bedside. Pt states he lives with his wife, daughter and brother.  Pt confirms PCP and insurance coverage.  Pt was driving self but brother confirms the family can provide needed transportation.  CM also discussed that pts medications need to be overseen at home for several days until pt clearer and can manage them. Brother voiced understanding.  Brother will transport home when pt is discharged.    Final next level of care: Home/Self Care Barriers to Discharge: No Barriers Identified   Patient Goals and CMS Choice      Discharge Placement                         Discharge Plan and Services Additional resources added to the After Visit Summary for                                       Social Determinants of Health (SDOH) Interventions SDOH Screenings   Depression (PHQ2-9): Medium Risk (06/25/2022)  Tobacco Use: Low Risk  (08/07/2022)     Readmission Risk Interventions     No data to display

## 2022-10-29 NOTE — Discharge Summary (Signed)
[facility]  Physician Discharge Summary   Patient ID: Omar Robertson MRN: [id number] DOB/AGE: 03/14/1981 42 y.o.  Admit date: 10/27/2022 Discharge date: 10/29/2022    PCP: Elsie Stain, MD  DISCHARGE DIAGNOSES:    Seizures ([place])   Epilepsy [facility])   Mixed hyperlipidemia   TBI (traumatic brain injury) ([place])   Depression   RECOMMENDATIONS FOR OUTPATIENT FOLLOW UP: Patient to follow-up with his PCP   Home Health: None Equipment/Devices: None  CODE STATUS: Full code  DISCHARGE CONDITION: fair  Diet recommendation: As before  INITIAL HISTORY: This is a 42 year old Spanish-speaking male with past medical history of recurrent  ?intractable seizures (follows with neurology and is known to be noncompliant with medications), resected brain cyst, depression. Patient presented to the hospital again for worsening mental status noted seizures at home 3 the day prior to admission, 1 the morning of admission and 1 in the emergency department.  Neurology consulted and hospitalist called for admission.   Consultations: Neurology   HOSPITAL COURSE:   Acute breakthrough seizure in the setting of known noncompliance Family confirms patient has been noncompliant with medications over the past few days due to family stressors.  Followed by Dr. Delice Lesch in the outpatient setting.  Last seen towards the end of 2023. -Loaded with Keppra, Vimpat in the ED -Neurology following, appreciate insight recommendations -Continue Keppra 1000 mg twice daily, Vimpat 200 mg twice daily and Zonegran 500 mg at bedtime -Imaging negative thus far Patient mentation appears to have improved. Seems to be back to baseline.  He has refills for his antiepileptic medications at the community pharmacy.   Acute metabolic encephalopathy Postictal psychosis, recurrent -Likely secondary to prolonged postictal state and recurrent seizures -Imaging negative, infectious workup negative as  above Seems to be close to baseline.   Profound medication noncompliance  -As above family confirms patient skips medications quite frequently, likely the etiology of his recurrent hospitalizations for seizures mental status changes and postictal psychosis. Compliance has been emphasized.   Hyperkalemia Now with low potassium level which will be supplemented prior to discharge.   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  Okay for discharge home today.  He has ambulated in the hallway without difficulty.   PERTINENT LABS:  The results of significant diagnostics from this hospitalization (including imaging, microbiology, ancillary and laboratory) are listed below for reference.     Labs:   Basic Metabolic Panel: Recent Labs  Lab 10/27/22 1839 10/28/22 0020 10/28/22 0150 10/29/22 0345  NA 142 138 139 141  K 3.5 5.7* 3.3* 3.2*  CL 110 112* 110 109  CO2 21* 18* 20* 21*  GLUCOSE 107* 103* 114* 92  BUN '12 10 11 12  '$ CREATININE 1.06 0.86 1.06 1.10  CALCIUM 9.4 8.5* 8.9 9.0   Liver Function Tests: Recent Labs  Lab 10/27/22 1839  AST 19  ALT 18  ALKPHOS 68  BILITOT 0.5  PROT 8.0  ALBUMIN 4.4    Recent Labs  Lab 10/27/22 1839  AMMONIA 14   CBC: Recent Labs  Lab 10/27/22 1839 10/28/22 0020 10/28/22 0150 10/29/22 0345  WBC 8.5 7.9 7.9 8.0  NEUTROABS  --  4.6  --   --   HGB 13.8 12.0* 12.4* 11.9*  HCT 44.4 36.5* 39.1 36.2*  MCV 87.1 86.9 85.7 84.6  PLT 304 228 260 233   Cardiac Enzymes: Recent Labs  Lab 10/28/22  [id number]     CBG: Recent Labs  Lab 10/27/22 2016  GLUCAP 108*     IMAGING STUDIES CT Head Wo Contrast  Result Date: 10/27/2022 CLINICAL DATA:  Altered level of consciousness, seizures EXAM: CT HEAD WITHOUT CONTRAST TECHNIQUE: Contiguous axial images were obtained from the base of the skull through the  vertex without intravenous contrast. RADIATION DOSE REDUCTION: This exam was performed according to the departmental dose-optimization program which includes automated exposure control, adjustment of the mA and/or kV according to patient size and/or use of iterative reconstruction technique. COMPARISON:  01/29/2022 FINDINGS: Brain: Stable encephalomalacia within the right frontal lobe. No evidence of acute infarct or hemorrhage. Lateral ventricles are unremarkable. 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 Remaining midline structures are unremarkable. No acute extra-axial fluid collections. No mass effect. Vascular: No hyperdense vessel or unexpected calcification. Skull: Postsurgical changes from prior left frontal craniotomy and right frontal burr hole. No acute bony abnormalities. Sinuses/Orbits: No acute finding. Other: None. IMPRESSION: 1. Stable postsurgical changes. 2. No acute intracranial process. 3. 5 x 6 mm colloid cyst.  No evidence of hydrocephalus. Electronically Signed   By: Randa Ngo M.D.   On: 10/27/2022 22:22   DG Chest Port 1 View  Result Date: 10/27/2022 CLINICAL DATA:  Altered level of consciousness, seizure EXAM: PORTABLE CHEST 1 VIEW COMPARISON:  01/31/2021 FINDINGS: Single frontal view of the chest demonstrates an unremarkable cardiac silhouette. No airspace disease, effusion, or pneumothorax. No acute bony abnormalities. IMPRESSION: 1. No acute intrathoracic process. Electronically Signed   By: Randa Ngo M.D.   On: 10/27/2022 20:27    DISCHARGE EXAMINATION: See progress note from earlier today  DISPOSITION: Home  Discharge Instructions     Call MD for:  difficulty breathing, headache or visual disturbances   Complete by: As directed    Call MD for:  extreme fatigue   Complete by: As directed    Call MD for:  persistant dizziness or light-headedness   Complete by: As directed    Call MD for:  persistant nausea and  vomiting   Complete by: As directed    Call MD for:  severe uncontrolled pain   Complete by: As directed    Call MD for:  temperature >100.4   Complete by: As directed    Diet - low sodium heart healthy   Complete by: As directed    Discharge instructions   Complete by: As directed    Please be sure to follow-up with your primary care provider.  Please be sure to take your medications as prescribed.  Please do not miss any doses of her seizure medicines.  Make sure that you get your refills.  You were cared for by a hospitalist during your hospital stay. If you have any questions about your discharge medications or the care you received while you were in the hospital after you are discharged, you can call the unit and asked to speak with the hospitalist on call if the hospitalist that took care of you is not available. Once you are discharged, your primary care physician will handle any further medical issues. Please note that NO REFILLS for any discharge medications will be authorized once you are discharged, as it is imperative that you return to your primary care physician (or establish a relationship with a primary care physician if you do not have one) for your aftercare needs so that they can reassess your need for medications  and monitor your lab values. If you do not have a primary care physician, you can call [phone] for a physician referral.   Increase activity slowly   Complete by: As directed           Allergies as of 10/29/2022   No Known Allergies      Medication List     STOP taking these medications    atorvastatin 10 MG tablet Commonly known as: LIPITOR       TAKE these medications    escitalopram 20 MG tablet Commonly known as: Lexapro Tome 1 tableta (20 mg en total) por va oral diariamente. (Take 1 tablet (20 mg total) by mouth daily.)   lacosamide 200 MG Tabs tablet Commonly known as: VIMPAT Take 1 tablet (200 mg total) by mouth 2 (two) times  daily.   levETIRAcetam 1000 MG tablet Commonly known as: Keppra Take 1 tablet (1,000 mg total) by mouth 2 (two) times daily.   zonisamide 100 MG capsule Commonly known as: ZONEGRAN Take 5 capsules by mouth every night.          Follow-up Information     Elsie Stain, MD Follow up.   Specialty: Pulmonary Disease Why: post hospitalization follow up Contact information: [address] [place] [address] [phone]                 TOTAL DISCHARGE TIME: 24 minutes  [place]  [facility] Pager on [email or web address]  10/30/2022, 10:14 AM

## 2022-10-29 NOTE — Progress Notes (Signed)
PROGRESS NOTE    Omar Robertson  [id number] DOB: February 12, 1981 DOA: 10/27/2022 PCP: Elsie Stain, MD   Brief Narrative:  This is a 41 year old Spanish-speaking male with past medical history of recurrent  ?intractable seizures (follows with neurology and is known to be noncompliant with medications), resected brain cyst, depression. Patient presented to the hospital again for worsening mental status noted seizures at home 3 the day prior to admission, 1 the morning of admission and 1 in the emergency department.  Neurology consulted and hospitalist called for admission.   Assessment & Plan:  Acute breakthrough seizure in the setting of known noncompliance Family confirms patient has been noncompliant with medications over the past few days due to family stressors -Loaded with Keppra, Vimpat in the ED -Neurology following, appreciate insight recommendations -Continue Keppra 1000 mg twice daily, Vimpat 200 mg twice daily and Zonegran 500 mg at bedtime -Imaging negative thus far Patient mentation appears to have improved. Await neurology input today.   Acute metabolic encephalopathy Postictal psychosis, recurrent -Likely secondary to prolonged postictal state and recurrent seizures -Imaging negative, infectious workup negative as above Seems to be close to baseline.   Profound medication noncompliance  -As above family confirms patient skips medications quite frequently, likely the etiology of his recurrent hospitalizations for seizures mental status changes and postictal psychosis. Compliance has been emphasized.  Hyperkalemia Now with low potassium level which will be supplemented.  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    Consultants:  Neurology  Procedures:  None  Antimicrobials:  None indicated  Subjective: Awake alert this morning.  Denies any headaches.  Objective: Vitals:   10/28/22 2002 10/28/22 2336 10/29/22 0330 10/29/22 0838  BP: 113/78 109/71 106/73 103/72  Pulse: 67 63 (!) 59 (!) 59  Resp: '16 14 14 17  '$ Temp: (!) 97.4 F (36.3 C) 98.5 F (36.9 C) 97.8 F (36.6 C) 98.2 F (36.8 C)  TempSrc: Oral Oral Oral Oral  SpO2: 100% 100% 100% 100%  Weight:      Height:        Intake/Output Summary (Last 24 hours) at 10/29/2022 0937 Last data filed at 10/29/2022 0349 Gross per 24 hour  Intake 3284.39 ml  Output 800 ml  Net 2484.39 ml   Filed Weights   10/27/22 1944 10/28/22 1656  Weight: 72.7 kg 68.1 kg    Examination:  General appearance: Awake alert.  In no distress Resp: Clear to auscultation bilaterally.  Normal effort Cardio: S1-S2 is normal regular.  No S3-S4.  No rubs murmurs or bruit GI: Abdomen is soft.  Nontender nondistended.  Bowel sounds are present normal.  No masses organomegaly Extremities: No edema.  Full range of motion of lower extremities. Neurologic:   No focal neurological deficits.     Data Reviewed: I have personally reviewed following labs and imaging studies  CBC: Recent Labs  Lab 10/27/22 1839 10/28/22 0020 10/28/22 0150 10/29/22 0345  WBC 8.5 7.9 7.9 8.0  NEUTROABS  --  4.6  --   --   HGB 13.8 12.0* 12.4* 11.9*  HCT 44.4 36.5* 39.1 36.2*  MCV 87.1 86.9 85.7 84.6  PLT 304 228 260 0000000    Basic Metabolic Panel: Recent Labs  Lab 10/27/22 1839 10/28/22 0020 10/28/22 0150 10/29/22 0345  NA 142  138 139 141  K 3.5 5.7* 3.3* 3.2*  CL 110 112* 110 109  CO2 21* 18* 20* 21*  GLUCOSE 107* 103* 114* 92  BUN '12 10 11 12  '$ CREATININE 1.06 0.86 1.06 1.10  CALCIUM 9.4 8.5* 8.9 9.0    GFR: Estimated Creatinine Clearance: 83.4 mL/min (by C-G formula based on SCr of 1.1 mg/dL). Liver Function Tests: Recent Labs  Lab  10/27/22 1839  AST 19  ALT 18  ALKPHOS 68  BILITOT 0.5  PROT 8.0  ALBUMIN 4.4    Recent Labs  Lab 10/27/22 1839  AMMONIA 14    Cardiac Enzymes: Recent Labs  Lab 10/28/22 0150  CKTOTAL 57    CBG: Recent Labs  Lab 10/27/22 2016  GLUCAP 108*    Thyroid Function Tests: Recent Labs    10/28/22 0020  TSH 0.928    Anemia Panel: Recent Labs    10/28/22 0020  VITAMINB12 388  FOLATE 19.5      Radiology Studies: CT Head Wo Contrast  Result Date: 10/27/2022 CLINICAL DATA:  Altered level of consciousness, seizures EXAM: CT HEAD WITHOUT CONTRAST TECHNIQUE: Contiguous axial images were obtained from the base of the skull through the vertex without intravenous contrast. RADIATION DOSE REDUCTION: This exam was performed according to the departmental dose-optimization program which includes automated exposure control, adjustment of the mA and/or kV according to patient size and/or use of iterative reconstruction technique. COMPARISON:  01/29/2022 FINDINGS: Brain: Stable encephalomalacia within the right frontal lobe. No evidence of acute infarct or hemorrhage. Lateral ventricles are unremarkable. 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 Remaining midline structures are unremarkable. No acute extra-axial fluid collections. No mass effect. Vascular: No hyperdense vessel or unexpected calcification. Skull: Postsurgical changes from prior left frontal craniotomy and right frontal burr hole. No acute bony abnormalities. Sinuses/Orbits: No acute finding. Other: None. IMPRESSION: 1. Stable postsurgical changes. 2. No acute intracranial process. 3. 5 x 6 mm colloid cyst.  No evidence of hydrocephalus. Electronically Signed   By: Randa Ngo M.D.   On: 10/27/2022 22:22   DG Chest Port 1 View  Result Date: 10/27/2022 CLINICAL DATA:  Altered level of consciousness, seizure EXAM: PORTABLE CHEST 1 VIEW COMPARISON:  01/31/2021 FINDINGS: Single  frontal view of the chest demonstrates an unremarkable cardiac silhouette. No airspace disease, effusion, or pneumothorax. No acute bony abnormalities. IMPRESSION: 1. No acute intrathoracic process. Electronically Signed   By: Randa Ngo M.D.   On: 10/27/2022 20:27    Scheduled Meds:  enoxaparin (LOVENOX) injection  40 mg Subcutaneous Q24H   escitalopram  20 mg Oral Daily   lacosamide  200 mg Oral BID   levETIRAcetam  1,000 mg Oral BID   potassium chloride  40 mEq Oral Once   zonisamide  500 mg Oral QHS   Continuous Infusions:  lactated ringers 125 mL/hr at 10/29/22 0349     LOS: 2 days    Bonnielee Haff,  [facility]  If 7PM-7AM, please contact night-coverage [email or web address]  10/29/2022, 9:37 AM

## 2022-10-30 ENCOUNTER — Telehealth: Payer: Self-pay

## 2022-10-30 NOTE — Transitions of Care (Post Inpatient/ED Visit) (Signed)
   10/30/2022  Name: Giacomo Ilsley MRN: [id number] DOB: 11-22-1980  Today's TOC FU Call Status: Today's TOC FU Call Status:: Successful TOC FU Call Competed TOC FU Call Complete Date: 10/30/22  Call completed with assistance of Spanish Interpreter [id number]/[employer]  Transition Care Management Follow-up Telephone Call Date of Discharge: 10/29/22 Discharge [facility]) Type of Discharge: Inpatient Admission Primary Inpatient Discharge Diagnosis:: seizures How have you been since you were released from the hospital?: Better Any questions or concerns?: No  Items Reviewed: Did you receive and understand the discharge instructions provided?: Yes Medications obtained and verified?: Yes (Medications Reviewed) Any new allergies since your discharge?: No Dietary orders reviewed?: Yes Type of Diet Ordered:: heart healthy Do you have support at home?: Yes People in Home: spouse Name of Support/Comfort Primary Source: wife  Home Care and Equipment/Supplies: [place] Ordered?: No Any new equipment or medical supplies ordered?: No  Functional Questionnaire: Do you need assistance with bathing/showering or dressing?: No Do you need assistance with meal preparation?: No Do you need assistance with eating?: No Do you have difficulty maintaining continence: No Do you need assistance with getting out of bed/getting out of a chair/moving?: No Do you have difficulty managing or taking your medications?: No  Folllow up appointments reviewed: PCP Follow-up appointment confirmed?: Yes Date of PCP follow-up appointment?: 11/20/22 Follow-up Provider: Dr Joya Gaskins [facility] Follow-up appointment confirmed?: Yes Date of Specialist follow-up appointment?: 11/13/22 Follow-Up Specialty Provider:: neurology Do you need transportation to your follow-up appointment?: No Do you understand care options if your condition(s) worsen?: Yes-patient  verbalized understanding    SIGNATURE  Eden Lathe, RN

## 2022-11-03 ENCOUNTER — Other Ambulatory Visit: Payer: Self-pay

## 2022-11-05 ENCOUNTER — Other Ambulatory Visit: Payer: Self-pay

## 2022-11-07 ENCOUNTER — Other Ambulatory Visit: Payer: Self-pay

## 2022-11-13 ENCOUNTER — Other Ambulatory Visit: Payer: Self-pay

## 2022-11-13 ENCOUNTER — Ambulatory Visit: Payer: Commercial Managed Care - HMO | Admitting: Neurology

## 2022-11-13 ENCOUNTER — Encounter: Payer: Self-pay | Admitting: Neurology

## 2022-11-13 VITALS — BP 110/58 | HR 69 | Ht 69.0 in | Wt 156.8 lb

## 2022-11-13 DIAGNOSIS — G40219 Localization-related (focal) (partial) symptomatic epilepsy and epileptic syndromes with complex partial seizures, intractable, without status epilepticus: Secondary | ICD-10-CM

## 2022-11-13 DIAGNOSIS — G40802 Other epilepsy, not intractable, without status epilepticus: Secondary | ICD-10-CM | POA: Diagnosis not present

## 2022-11-13 MED ORDER — LEVETIRACETAM 1000 MG PO TABS
1000.0000 mg | ORAL_TABLET | Freq: Two times a day (BID) | ORAL | 11 refills | Status: DC
  Filled 2022-11-13 – 2022-12-01 (×2): qty 60, 30d supply, fill #0
  Filled 2023-01-01: qty 60, 30d supply, fill #1
  Filled 2023-02-02: qty 60, 30d supply, fill #2

## 2022-11-13 MED ORDER — LACOSAMIDE 200 MG PO TABS
200.0000 mg | ORAL_TABLET | Freq: Two times a day (BID) | ORAL | 5 refills | Status: DC
  Filled 2022-11-13 – 2022-12-01 (×2): qty 60, 30d supply, fill #0
  Filled 2023-01-01: qty 60, 30d supply, fill #1
  Filled 2023-02-02: qty 60, 30d supply, fill #2

## 2022-11-13 MED ORDER — ZONISAMIDE 100 MG PO CAPS
ORAL_CAPSULE | ORAL | 6 refills | Status: DC
  Filled 2022-11-13: qty 150, fill #0
  Filled 2022-12-01: qty 150, 30d supply, fill #0
  Filled 2023-01-01: qty 150, 30d supply, fill #1
  Filled 2023-02-02: qty 150, 30d supply, fill #2

## 2022-11-13 NOTE — Patient Instructions (Addendum)
Que bueno verte. Contine con todos sus medicamentos. Seguimiento en 3-4 meses, llamar para cualquier cambio.   Precauciones ante las convulsiones: 1. Si le han recetado medicamentos para prevenir las convulsiones, tmelos exactamente como se le indica. No deje de tomar el medicamento sin consultar primero con su mdico, incluso si no ha tenido Medical laboratory scientific officer.  2. Evite actividades en las que una convulsin pueda causar peligro para usted o para los dems. No opere maquinaria peligrosa, nade solo ni trepe a lugares altos o peligrosos, como escaleras, techos o vigas. No conduzca a menos que su mdico se lo indique.  3. Si tiene algn aviso de que puede sufrir una convulsin, acustese en un lugar seguro donde no pueda lastimarse.  4. No conducir durante 6 meses desde la ltima incautacin, segn la ley estatal de [place]. Consulte el siguiente enlace en el sitio web de la [employer]ile[employer] [place] para obtener ms informacin: http://www.epilepsyfoundation.org/answerplace/Social/driving/drivingu.cfm  5. Mantenga una buena higiene del sueo.  6. Comunquese con su mdico si tiene algn problema que pueda estar relacionado con el medicamento que est tomando.  7. Llame al 911 y lleve al paciente de regreso al servicio de urgencias si:       R. La convulsin dura ms de 5 minutos. B. El paciente no despierta poco despus de la convulsin. C. El paciente tiene nuevos problemas como dificultad para ver, hablar o moverse. D. El paciente result herido durante la convulsin. E. El paciente tiene [place] superior a 102 F (39 C) F. El paciente vomit y ahora tiene problemas para Ambulance person.   Good to see you. Continue all your medications. Follow-up in 3-4 months, call for any changes.    Seizure Precautions: 1. If medication has been prescribed for you to prevent seizures, take it exactly as directed.  Do not stop taking the medicine without talking to your  doctor first, even if you have not had a seizure in a long time.   2. Avoid activities in which a seizure would cause danger to yourself or to others.  Don't operate dangerous machinery, swim alone, or climb in high or dangerous places, such as on ladders, roofs, or girders.  Do not drive unless your doctor says you may.  3. If you have any warning that you may have a seizure, lay down in a safe place where you can't hurt yourself.    4.  No driving for 6 months from last seizure, as per [facility].   Please refer to the following link on the [place] website for more information: http://www.epilepsyfoundation.org/answerplace/Social/driving/drivingu.cfm   5.  Maintain good sleep hygiene.   6.  Contact your doctor if you have any problems that may be related to the medicine you are taking.  7.  Call 911 and bring the patient back to the ED if:        A.  The seizure lasts longer than 5 minutes.       B.  The patient doesn't awaken shortly after the seizure  C.  The patient has new problems such as difficulty seeing, speaking or moving  D.  The patient was injured during the seizure  E.  The patient has a temperature over 102 F (39C)  F.  The patient vomited and now is having trouble breathing

## 2022-11-13 NOTE — Progress Notes (Signed)
NEUROLOGY FOLLOW UP OFFICE NOTE  Omar Robertson [id number] 1980/10/21  HISTORY OF PRESENT ILLNESS: I had the pleasure of seeing Omar Robertson in follow-up in the neurology clinic on 11/13/2022.  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 He is again accompanied by his son who helps supplement the history today.  A Spanish medical interpreter Omar Robertson helps with translation. Records and images were personally reviewed where available.  On his last visit, they were reporting around 1 seizure a month. Zonisamide increased to 500mg  qhs, he is also on Levetiracetam 1000mg  BID and Lacosamide 200mg  BID. He reports that there was such an improvement in his seizures that he felt he was better and stopped taking his medications. After 3 days, he "completely lost my mind." Family noticed he was not acting himself, they were unaware he stopped medications. He had 3 seizures on 2/25, then had 2 seizures on 2/26 (one in the ER where he vocalized, had a blank stare, then had a GTC). His son reported that every 2 years or so, he randomly stops taking his medications and would not sleep. He would get post-ictal psychosis after seizure clusters, he was hallucinating this time, seeing dead family members on the phone or in the room. After 1-2 days of taking his medications again, he was back to himself. They report that prior to these seizures, last seizure was 08/20/22. He reports episodes of very mild weird sensation in his body that was hard to describe. No olfactory/gustatory hallucinations, focal numbness/tingling/weakness, myoclonic jerks. No headaches, dizziness, vision changes. He reports mood is fine.    History on Initial Assessment 09/10/2021: This is a 42 year old right-handed man with a history of "3 brain cysts" s/p surgery 12 years ago in [place], presenting to establish care for epilepsy. They report seizures started around age 3, he was hospitalized and the cysts were found.  Surgery was done on the 2 cysts, they were told the 3rd cyst was not amenable to surgery. Records unavailable for review. Head CT in 07/2020 showed remote right frontal craniotomy and burr hole, postsurgical changes in the right frontal lobe, 38mm colloid cyst at the level of the foramina of [place]. He describes seizures as drifting off in his mind, "thinks of something else," then does not know what happens. Omar Robertson has witnessed seizures where his face turns red, he would be drooling, unresponsive, then fall. Sometimes he would have brief shaking lasting for 1 minute. He used to bite his tongue. He would be confused for 15 minutes, then would have difficulty sleeping that night, awake the whole night until the next day. No focal weakness but Omar Robertson notes he has "some kind of discomfort" after the seizures. He has around 2 seizures a month, brought on "by surprise that caused him a big emotion." Last seizure was 08/28/21. They report he was tried on different unrecalled medications in the past but continued to have seizures. He has been on Levetiracetam for many years, when they moved to [place], Vimpat was started 3 years ago. He was on Vimpat 200mg  BID and Levetiracetam 1000mg  BID, however during hospital admission for seizures in 12/2020 (in setting of running out of medication), he was discharged on Vimpat 100mg  BID, notes indicate "Per pharmacist, cost of vimpat may be prohibitive for pt. Would discharge on slightly lower dose which would be fully covered and have pt f/u with [facility] as per [facility] documentation for further refills." His son reports an increase in  seizures since reduction of dose.   He denies any olfactory/gustatory hallucinations, deja vu, rising epigastric sensation, focal numbness/tingling/weakness, myoclonic jerks. He denies any headaches, dizziness, vision changes, no falls. He forgets a lot of things, they report a lot of memory loss where he could not remember conversations. He  is currently unemployed and not driving.   Epilepsy Risk Factors:  Intracranial cysts s/p surgery on 2 with right frontal lobe postsurgical changes on head CT, 84mm colloid cyst in foramen of [place]. He reports that he fell and injured his head as a child in [place], he had stitches but was told "there is still blood in the brain." Maternal cousin has seizures. He had a normal birth and early development.  There is no history of febrile convulsions, CNS infections such as meningitis/encephalitis.  PAST MEDICAL HISTORY: Past Medical History:  Diagnosis Date   Depression    Epilepsy ([place])    Pneumothorax 01/28/2021   Seizure ([place])    Seizures ([place])    Traumatic brain injury with loss of consciousness ([place]) 01/22/2021    MEDICATIONS: Current Outpatient Medications on File Prior to Visit  Medication Sig Dispense Refill   escitalopram (LEXAPRO) 20 MG tablet Take 1 tablet (20 mg total) by mouth daily. 60 tablet 3   lacosamide (VIMPAT) 200 MG TABS tablet Take 1 tablet (200 mg total) by mouth 2 (two) times daily. 60 tablet 5   levETIRAcetam (KEPPRA) 1000 MG tablet Take 1 tablet (1,000 mg total) by mouth 2 (two) times daily. 60 tablet 11   zonisamide (ZONEGRAN) 100 MG capsule Take 5 capsules by mouth every night. 150 capsule 6   No current facility-administered medications on file prior to visit.    ALLERGIES: No Known Allergies  FAMILY HISTORY: Family History  Problem Relation Age of Onset   Seizures Neg Hx     SOCIAL HISTORY: Social History   Socioeconomic History   Marital status: Soil scientist    Spouse name: Not on file   Number of children: Not on file   Years of education: Not on file   Highest education level: Not on file  Occupational History   Not on file  Tobacco Use   Smoking status: Never   Smokeless tobacco: Never  Vaping Use   Vaping Use: Never used  Substance and Sexual Activity   Alcohol use: Not Currently   Drug use: Not Currently   Sexual activity:  Not Currently  Other Topics Concern   Not on file  Social History Narrative   Right handed    Drinks no caffeine    Social Determinants of Health   Financial Resource Strain: Not on file  Food Insecurity: Not on file  Transportation Needs: Not on file  Physical Activity: Not on file  Stress: Not on file  Social Connections: Not on file  Intimate Partner Violence: Not on file     PHYSICAL EXAM: Vitals:   11/13/22 1123  BP: (!) 110/58  Pulse: 69  SpO2: 99%   General: No acute distress Head:  Normocephalic/atraumatic, pterygium on medial sides of both eyes Skin/Extremities: No rash, no edema Neurological Exam: alert and awake. No aphasia or dysarthria. Fund of knowledge is appropriate. Attention and concentration are normal.   Cranial nerves: Pupils equal, round. Extraocular movements intact with no nystagmus. Visual fields full.  No facial asymmetry.  Motor: Bulk and tone normal, muscle strength 5/5 throughout with no pronator drift.   Finger to nose testing intact.  Gait narrow-based and steady, able  to tandem walk adequately.  Romberg negative.   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 EEG showed rare spikes over the right frontopolar region. He had breakthrough seizures with post-ictal psychosis then a convulsion on 10/27/22 after stopping medication for 3 days. Continue Zonisamide 500mg  qhs, Levetiracetam 1000mg  BID, and Lacosamide 200mg  BID. We again discussed importance of medication compliance. He is aware of [place] driving laws to stop driving until 6 months seizure-free. Follow-up in 3-4 months, call for any changes.   Thank you for  allowing me to participate in his care.  Please do not hesitate to call for any questions or concerns.    Ellouise Newer, M.D.   CC: Dr. Joya Gaskins

## 2022-11-20 ENCOUNTER — Encounter: Payer: Self-pay | Admitting: Critical Care Medicine

## 2022-11-20 ENCOUNTER — Ambulatory Visit: Payer: Commercial Managed Care - HMO | Attending: Critical Care Medicine | Admitting: Critical Care Medicine

## 2022-11-20 VITALS — BP 105/63 | HR 57 | Ht 69.0 in | Wt 154.4 lb

## 2022-11-20 DIAGNOSIS — Z23 Encounter for immunization: Secondary | ICD-10-CM | POA: Diagnosis not present

## 2022-11-20 DIAGNOSIS — E876 Hypokalemia: Secondary | ICD-10-CM

## 2022-11-20 DIAGNOSIS — G40802 Other epilepsy, not intractable, without status epilepticus: Secondary | ICD-10-CM | POA: Diagnosis not present

## 2022-11-20 DIAGNOSIS — Q046 Congenital cerebral cysts: Secondary | ICD-10-CM | POA: Diagnosis not present

## 2022-11-20 DIAGNOSIS — E782 Mixed hyperlipidemia: Secondary | ICD-10-CM

## 2022-11-20 DIAGNOSIS — K056 Periodontal disease, unspecified: Secondary | ICD-10-CM

## 2022-11-20 DIAGNOSIS — F330 Major depressive disorder, recurrent, mild: Secondary | ICD-10-CM | POA: Diagnosis not present

## 2022-11-20 NOTE — Assessment & Plan Note (Signed)
On treatment.  

## 2022-11-20 NOTE — Assessment & Plan Note (Signed)
Recurrent seizures emphasized the need to stay on medications and follow-up with neurology

## 2022-11-20 NOTE — Assessment & Plan Note (Signed)
Patient with periodontal disease encouraged to follow-up with dentistry

## 2022-11-20 NOTE — Progress Notes (Signed)
No concerns. Wife present in room with patient.

## 2022-11-20 NOTE — Assessment & Plan Note (Signed)
Reassess metabolic panel 

## 2022-11-20 NOTE — Patient Instructions (Addendum)
No change in medications  Refills on lexapro sent to pharmacy Labs today : check potassium Return 4 months Flu shot today  Sin cambios en los medicamentos. Recargas de lexapro enviadas a farmacia [facility] [place]: Forensic psychologist 4 meses Vacuna contra la gripe hoy

## 2022-11-20 NOTE — Assessment & Plan Note (Signed)
No indication for lipid therapy

## 2022-11-20 NOTE — Assessment & Plan Note (Signed)
Depression improved on Lexapro continue same

## 2022-11-20 NOTE — Progress Notes (Signed)
Established Patient Office Visit  Subjective   Patient ID: Omar Robertson, male    DOB: April 22, 1981  Age: 42 y.o. MRN: [id number]  Chief Complaint  Patient presents with   Hospitalization Follow-up    This is a 42 year old male last seen by Dr. Joya Gaskins in May 2023.  Patient has mixed hyperlipidemia recurrent depression and recurrent seizure disorder.  Patient was readmitted with recurrent seizures from the 26 and 28 February.  He had stopped his seizure medicines for about 3 days and then resumed.  On arrival blood pressure 105/63.  He has been followed up by neurology and is back on his seizure medications without dose change.  Note he does have poor dentition will need a dental appointment.  This visit was assisted by yamika [phone] Patient states since being on Lexapro his depression has improved.  He had hypokalemia at the last hospital visit he needs his potassium rechecked  Discharge summary as below Admit date: 10/27/2022 Discharge date: 10/29/2022     PCP: Elsie Stain, MD   DISCHARGE DIAGNOSES:    Seizures ([place])   Epilepsy ([place])   Mixed hyperlipidemia   TBI (traumatic brain injury) ([place])   Depression     RECOMMENDATIONS FOR OUTPATIENT FOLLOW UP: 1. Patient to follow-up with his PCP     Home Health: None Equipment/Devices: None   CODE STATUS: Full code   DISCHARGE CONDITION: fair   Diet recommendation: As before   INITIAL HISTORY: This is a 42 year old Spanish-speaking male with past medical history of recurrent  ?intractable seizures (follows with neurology and is known to be noncompliant with medications), resected brain cyst, depression. Patient presented to the hospital again for worsening mental status noted seizures at home 3 the day prior to admission, 1 the morning of admission and 1 in the emergency department.  Neurology consulted and hospitalist called for admission.    Consultations:  Neurology     HOSPITAL COURSE:    Acute  breakthrough seizure in the setting of known noncompliance Family confirms patient has been noncompliant with medications over the past few days due to family stressors.  Followed by Dr. Delice Lesch in the outpatient setting.  Last seen towards the end of 2023. -Loaded with Keppra, Vimpat in the ED -Neurology following, appreciate insight recommendations -Continue Keppra 1000 mg twice daily, Vimpat 200 mg twice daily and Zonegran 500 mg at bedtime -Imaging negative thus far Patient mentation appears to have improved. Seems to be back to baseline.  He has refills for his antiepileptic medications at the community pharmacy.   Acute metabolic encephalopathy Postictal psychosis, recurrent -Likely secondary to prolonged postictal state and recurrent seizures -Imaging negative, infectious workup negative as above Seems to be close to baseline.   Profound medication noncompliance  -As above family confirms patient skips medications quite frequently, likely the etiology of his recurrent hospitalizations for seizures mental status changes and postictal psychosis. Compliance has been emphasized.   Hyperkalemia Now with low potassium level which will be supplemented prior to discharge.   Depression -Continue Lexapro   Mixed hyperlipidemia H/o colloid brain cyst excision and TBI -No medication changes or further workup indicated   Patient is stable.  Okay for discharge home today.  He has ambulated in the hallway without difficulty.          Review of Systems  Constitutional:  Negative for chills, diaphoresis, fever, malaise/fatigue and weight loss.  HENT:  Negative for congestion, hearing loss, nosebleeds, sore throat and tinnitus.  Poor dentition  Eyes:  Negative for blurred vision, photophobia and redness.  Respiratory:  Negative for cough, hemoptysis, sputum production, shortness of breath, wheezing and stridor.   Cardiovascular:  Negative for chest pain, palpitations, orthopnea,  claudication, leg swelling and PND.  Gastrointestinal:  Negative for abdominal pain, blood in stool, constipation, diarrhea, heartburn, nausea and vomiting.  Genitourinary:  Negative for dysuria, flank pain, frequency, hematuria and urgency.  Musculoskeletal:  Negative for back pain, falls, joint pain, myalgias and neck pain.  Skin:  Negative for itching and rash.  Neurological:  Positive for seizures. Negative for dizziness, tingling, tremors, sensory change, speech change, focal weakness, loss of consciousness, weakness and headaches.  Endo/Heme/Allergies:  Negative for environmental allergies and polydipsia. Does not bruise/bleed easily.  Psychiatric/Behavioral:  Negative for depression, memory loss, substance abuse and suicidal ideas. The patient is not nervous/anxious and does not have insomnia.       Objective:     BP 105/63   Pulse (!) 57   Ht 5\' 9"  (1.753 m)   Wt 154 lb 6.4 oz (70 kg)   SpO2 100%   BMI 22.80 kg/m    Physical Exam Vitals reviewed.  Constitutional:      Appearance: Normal appearance. He is well-developed. He is not diaphoretic.  HENT:     Head: Normocephalic and atraumatic.     Nose: No nasal deformity, septal deviation, mucosal edema or rhinorrhea.     Right Sinus: No maxillary sinus tenderness or frontal sinus tenderness.     Left Sinus: No maxillary sinus tenderness or frontal sinus tenderness.     Mouth/Throat:     Mouth: Mucous membranes are moist.     Pharynx: Oropharynx is clear. No oropharyngeal exudate.     Comments: Periodontal disease Eyes:     General: No scleral icterus.    Conjunctiva/sclera: Conjunctivae normal.     Pupils: Pupils are equal, round, and reactive to light.  Neck:     Thyroid: No thyromegaly.     Vascular: No carotid bruit or JVD.     Trachea: Trachea normal. No tracheal tenderness or tracheal deviation.  Cardiovascular:     Rate and Rhythm: Normal rate and regular rhythm.     Chest Wall: PMI is not displaced.      Pulses: Normal pulses. No decreased pulses.     Heart sounds: Normal heart sounds, S1 normal and S2 normal. Heart sounds not distant. No murmur heard.    No systolic murmur is present.     No diastolic murmur is present.     No friction rub. No gallop. No S3 or S4 sounds.  Pulmonary:     Effort: No tachypnea, accessory muscle usage or respiratory distress.     Breath sounds: No stridor. No decreased breath sounds, wheezing, rhonchi or rales.  Chest:     Chest wall: No tenderness.  Abdominal:     General: Bowel sounds are normal. There is no distension.     Palpations: Abdomen is soft. Abdomen is not rigid.     Tenderness: There is no abdominal tenderness. There is no guarding or rebound.  Musculoskeletal:        General: Normal range of motion.     Cervical back: Normal range of motion and neck supple. No edema, erythema or rigidity. No muscular tenderness. Normal range of motion.  Lymphadenopathy:     Head:     Right side of head: No submental or submandibular adenopathy.     Left side of  head: No submental or submandibular adenopathy.     Cervical: No cervical adenopathy.  Skin:    General: Skin is warm and dry.     Coloration: Skin is not pale.     Findings: No rash.     Nails: There is no clubbing.  Neurological:     Mental Status: He is alert and oriented to person, place, and time.     Sensory: No sensory deficit.  Psychiatric:        Mood and Affect: Mood normal.        Speech: Speech normal.        Behavior: Behavior normal.        Thought Content: Thought content normal.        Judgment: Judgment normal.      No results found for any visits on 11/20/22.    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    Asencion Noble, MD

## 2022-11-21 LAB — BMP8+EGFR
BUN/Creatinine Ratio: 21 — ABNORMAL HIGH (ref 9–20)
BUN: 22 mg/dL (ref 6–24)
CO2: 20 mmol/L (ref 20–29)
Calcium: 9.5 mg/dL (ref 8.7–10.2)
Chloride: 110 mmol/L — ABNORMAL HIGH (ref 96–106)
Creatinine, Ser: 1.07 mg/dL (ref 0.76–1.27)
Glucose: 93 mg/dL (ref 70–99)
Potassium: 4.9 mmol/L (ref 3.5–5.2)
Sodium: 143 mmol/L (ref 134–144)
eGFR: 88 mL/min/{1.73_m2} (ref >59)

## 2022-11-21 NOTE — Progress Notes (Signed)
Let pt know labs normal  potassium back to normal

## 2022-11-24 ENCOUNTER — Telehealth: Payer: Self-pay

## 2022-11-24 NOTE — Telephone Encounter (Signed)
Pt was called and is aware of results, DOB was confirmed.  Interpreter id [phone]

## 2022-11-24 NOTE — Telephone Encounter (Signed)
-----   Message from Elsie Stain, MD sent at 11/21/2022  5:58 AM EDT ----- Let pt know labs normal  potassium back to normal

## 2022-12-01 ENCOUNTER — Other Ambulatory Visit: Payer: Self-pay

## 2022-12-03 ENCOUNTER — Other Ambulatory Visit: Payer: Self-pay

## 2022-12-17 ENCOUNTER — Ambulatory Visit: Payer: Self-pay | Admitting: Critical Care Medicine

## 2022-12-17 ENCOUNTER — Other Ambulatory Visit: Payer: Self-pay

## 2023-01-01 ENCOUNTER — Other Ambulatory Visit: Payer: Self-pay

## 2023-01-05 ENCOUNTER — Other Ambulatory Visit: Payer: Self-pay

## 2023-01-28 ENCOUNTER — Ambulatory Visit: Payer: Self-pay | Admitting: Neurology

## 2023-02-02 ENCOUNTER — Other Ambulatory Visit: Payer: Self-pay

## 2023-02-04 ENCOUNTER — Other Ambulatory Visit: Payer: Self-pay

## 2023-02-26 ENCOUNTER — Ambulatory Visit: Payer: Commercial Managed Care - HMO | Admitting: Neurology

## 2023-02-26 ENCOUNTER — Other Ambulatory Visit: Payer: Self-pay | Admitting: Physician Assistant

## 2023-02-26 ENCOUNTER — Other Ambulatory Visit: Payer: Self-pay

## 2023-02-26 ENCOUNTER — Encounter: Payer: Self-pay | Admitting: Neurology

## 2023-02-26 VITALS — BP 124/87 | HR 62 | Ht 69.0 in | Wt 156.6 lb

## 2023-02-26 DIAGNOSIS — G40802 Other epilepsy, not intractable, without status epilepticus: Secondary | ICD-10-CM

## 2023-02-26 DIAGNOSIS — F330 Major depressive disorder, recurrent, mild: Secondary | ICD-10-CM

## 2023-02-26 DIAGNOSIS — G40219 Localization-related (focal) (partial) symptomatic epilepsy and epileptic syndromes with complex partial seizures, intractable, without status epilepticus: Secondary | ICD-10-CM | POA: Diagnosis not present

## 2023-02-26 MED ORDER — LEVETIRACETAM 1000 MG PO TABS
ORAL_TABLET | Freq: Two times a day (BID) | ORAL | 11 refills | Status: DC
  Filled 2023-02-26: qty 90, fill #0
  Filled 2023-02-26: qty 90, 30d supply, fill #0
  Filled 2023-04-06 (×2): qty 90, 30d supply, fill #1
  Filled 2023-05-07: qty 90, 30d supply, fill #2
  Filled 2023-06-02: qty 90, 30d supply, fill #3
  Filled 2023-07-06: qty 90, 30d supply, fill #4

## 2023-02-26 MED ORDER — LACOSAMIDE 200 MG PO TABS
200.0000 mg | ORAL_TABLET | Freq: Two times a day (BID) | ORAL | 5 refills | Status: DC
  Filled 2023-02-26 – 2023-03-06 (×2): qty 60, 30d supply, fill #0
  Filled 2023-04-06 (×2): qty 60, 30d supply, fill #1
  Filled 2023-05-07: qty 60, 30d supply, fill #2
  Filled 2023-06-02: qty 60, 30d supply, fill #3
  Filled 2023-07-06: qty 60, 30d supply, fill #4

## 2023-02-26 MED ORDER — ESCITALOPRAM OXALATE 20 MG PO TABS
20.0000 mg | ORAL_TABLET | Freq: Every day | ORAL | 0 refills | Status: DC
  Filled 2023-02-26: qty 30, 30d supply, fill #0

## 2023-02-26 MED ORDER — ZONISAMIDE 100 MG PO CAPS
500.0000 mg | ORAL_CAPSULE | Freq: Every evening | ORAL | 11 refills | Status: DC
  Filled 2023-02-26: qty 150, fill #0
  Filled 2023-03-06: qty 150, 30d supply, fill #0
  Filled 2023-04-06 (×2): qty 150, 30d supply, fill #1
  Filled 2023-05-07: qty 140, 28d supply, fill #2
  Filled 2023-05-07: qty 10, 2d supply, fill #2
  Filled 2023-06-02: qty 150, 30d supply, fill #3
  Filled 2023-07-06: qty 150, 30d supply, fill #4

## 2023-02-26 NOTE — Progress Notes (Signed)
NEUROLOGY FOLLOW UP OFFICE NOTE  Omar Robertson [id number] 26-May-1981  HISTORY OF PRESENT ILLNESS: I had the pleasure of seeing Omar Robertson in follow-up in the neurology clinic on 02/26/2023.  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 He is again accompanied by his wife who helps supplement the history today. A Spanish medical interpreter, Aniceto Boss, helps with translation. Records and images were personally reviewed where available.  Since his last visit, his wife reports 3 seizures, most recently last month. He was sitting then his head turned to the right side for 2 minutes while he was unresponsive. No tongue bite, incontinence, focal weakness. He is amnestic of the seizures. He continues on Levetiracetam 1000mg  BID, Lacosamide 200mg  BID, and Zonisamide 500mg  at bedtime. When he was in the ER in February, he had stopped taking medications and had a GTC. He denies missing any medications since then, no sleep deprivation or alcohol. He denies any headaches, dizziness, drowsiness, focal numbness/tingling/weakness, no falls. Sleep and mood are good.    History on Initial Assessment 09/10/2021: This is a 42 year old right-handed man with a history of "3 brain cysts" s/p surgery 12 years ago in [place], presenting to establish care for epilepsy. They report seizures started around age 29, he was hospitalized and the cysts were found. Surgery was done on the 2 cysts, they were told the 3rd cyst was not amenable to surgery. Records unavailable for review. Head CT in 07/2020 showed remote right frontal craniotomy and burr hole, postsurgical changes in the right frontal lobe, 4mm colloid cyst at the level of the foramina of [place]. He describes seizures as drifting off in his mind, "thinks of something else," then does not know what happens. Tinnie Gens has witnessed seizures where his face turns red, he would be drooling, unresponsive, then fall. Sometimes he would have brief shaking  lasting for 1 minute. He used to bite his tongue. He would be confused for 15 minutes, then would have difficulty sleeping that night, awake the whole night until the next day. No focal weakness but Tinnie Gens notes he has "some kind of discomfort" after the seizures. He has around 2 seizures a month, brought on "by surprise that caused him a big emotion." Last seizure was 08/28/21. They report he was tried on different unrecalled medications in the past but continued to have seizures. He has been on Levetiracetam for many years, when they moved to [place], Vimpat was started 3 years ago. He was on Vimpat 200mg  BID and Levetiracetam 1000mg  BID, however during hospital admission for seizures in 12/2020 (in setting of running out of medication), he was discharged on Vimpat 100mg  BID, notes indicate "Per pharmacist, cost of vimpat may be prohibitive for pt. Would discharge on slightly lower dose which would be fully covered and have pt f/u with [facility] per [facility] documentation for further refills." His son reports an increase in seizures since reduction of dose.   He denies any olfactory/gustatory hallucinations, deja vu, rising epigastric sensation, focal numbness/tingling/weakness, myoclonic jerks. He denies any headaches, dizziness, vision changes, no falls. He forgets a lot of things, they report a lot of memory loss where he could not remember conversations. He is currently unemployed and not driving.   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 He reports that he fell and injured his head as a child in [place], he had stitches but was told "there is still blood in the brain." Maternal cousin has seizures. He had a normal birth and early development.  There is no history of febrile convulsions, CNS infections such as meningitis/encephalitis.  PAST MEDICAL HISTORY: Past Medical History:   Diagnosis Date   Depression    Epilepsy ([facility])    Pneumothorax 01/28/2021   Seizure ([facility])    Seizures ([facility])    Traumatic brain injury with loss of consciousness ([facility]) 01/22/2021    MEDICATIONS: Current Outpatient Medications on File Prior to Visit  Medication Sig Dispense Refill   escitalopram (LEXAPRO) 20 MG tablet Take 1 tablet (20 mg total) by mouth daily. 60 tablet 3   lacosamide (VIMPAT) 200 MG TABS tablet Take 1 tablet (200 mg total) by mouth 2 (two) times daily. 60 tablet 5   levETIRAcetam (KEPPRA) 1000 MG tablet Take 1 tablet (1,000 mg total) by mouth 2 (two) times daily. 60 tablet 11   zonisamide (ZONEGRAN) 100 MG capsule Take 5 capsules by mouth every night. 150 capsule 6   No current facility-administered medications on file prior to visit.    ALLERGIES: No Known Allergies  FAMILY HISTORY: Family History  Problem Relation Age of Onset   Seizures Neg Hx     SOCIAL HISTORY: Social History   Socioeconomic History   Marital status: Media planner    Spouse name: Not on file   Number of children: Not on file   Years of education: Not on file   Highest education level: Not on file  Occupational History   Not on file  Tobacco Use   Smoking status: Never   Smokeless tobacco: Never  Vaping Use   Vaping Use: Never used  Substance and Sexual Activity   Alcohol use: Not Currently   Drug use: Not Currently   Sexual activity: Not Currently  Other Topics Concern   Not on file  Social History Narrative   Right handed    Drinks no caffeine    Social Determinants of Health   Financial Resource Strain: Not on file  Food Insecurity: Not on file  Transportation Needs: Not on file  Physical Activity: Not on file  Stress: Not on file  Social Connections: Not on file  Intimate Partner Violence: Not on file     PHYSICAL EXAM: Vitals:   02/26/23 1135  BP: 124/87  Pulse: 62  SpO2: 98%   General: No acute distress Head:   Normocephalic/atraumatic Skin/Extremities: No rash, no edema Neurological Exam: alert and awake. No aphasia or dysarthria. Fund of knowledge is appropriate. Attention and concentration are normal.   Cranial nerves: Pupils equal, round. Extraocular movements intact with no nystagmus. Visual fields full.  No facial asymmetry.  Motor: Bulk and tone normal, muscle strength 5/5 throughout with no pronator drift.   Finger to nose testing intact.  Gait narrow-based and steady, able to tandem walk adequately.  Romberg negative.   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 EEG showed rare spikes over the right frontopolar region. Last GTC was in 10/2022 after stopping medications, since then he has had 3 focal seizures with impaired awareness in the last 3 months with no  clear triggers. Increase Levetiracetam to 1500mg  BID, continue Lacosamide 200mg  BID and Zonisamide 500mg  at bedtime. We may consider adding Xcopri on next visit, as well as referral to tertiary academic center for presurgical evaluation. He does not drive. Continue seizure calendar, follow-up in 3-4 months, call for any changes.    Thank you for allowing me to participate in his care.  Please do not hesitate to call for any questions or concerns.    Patrcia Dolly, M.D.   CC: Dr. Delford Field

## 2023-02-26 NOTE — Patient Instructions (Addendum)
Que bueno verte.  1. Aumente levetiracetam (Keppra) 1000 mg: tomar 1 y 1/2 comprimidos dos veces al da  2. Contine con Lacosamida (Vimpat) 200 mg: tome 1 tableta dos veces al da.  3. Continuar con Zonisamida 100 mg: tomar 5 cpsulas cada noche.  4. Seguimiento en 3-4 meses, llame para cualquier cambio.   Precauciones ante las convulsiones: 1. Si le han recetado medicamentos para prevenir las convulsiones, tmelos exactamente como se le indica.  No deje de tomar el medicamento sin consultar primero con su mdico, incluso si no ha tenido Office manager.   2. Evite actividades en las que una convulsin pueda causar peligro para usted o para los dems.  No opere maquinaria peligrosa, nade solo ni trepe a lugares altos o peligrosos, como escaleras, techos o vigas.  No conduzca a menos que su mdico se lo indique.  3. Si tiene algn aviso de que puede tener una convulsin, acustese en un lugar seguro donde no pueda lastimarse.    4. No conducir durante 6 meses desde la ltima incautacin, segn la ley estatal de [place].   Consulte el siguiente enlace en el sitio web de la Epilepsy Foundation of [place] para obtener ms informacin: http://www.epilepsyfoundation.org/answerplace/Social/driving/drivingu.cfm   5. Mantenga una buena higiene del sueo. Evite el alcohol.  6. Comunquese con su mdico si tiene algn problema que pueda estar relacionado con el medicamento que est tomando.  7. Llame al 911 y lleve al paciente de regreso al servicio de urgencias si:        R. La convulsin dura ms de 5 minutos.       B. El paciente no despierta poco despus de la convulsin.  C. El paciente tiene nuevos problemas como dificultad para ver, hablar o moverse.  D. El paciente result herido durante la convulsin.  E. El paciente tiene [place] superior a 102 F (39 C)  F. El paciente vomit y ahora tiene problemas para Industrial/product designer.   Good to see you.  Increase  Levetiracetam (Keppra) 1000mg : Take 1 and 1/2 tablets twice a day  2. Continue Lacosamide (Vimpat) 200mg : take 1 tablet twice a day  3. Continue Zonisamide 100mg : take 5 capsules every night  4. Follow-up in 3-4 months, call for any changes   Seizure Precautions: 1. If medication has been prescribed for you to prevent seizures, take it exactly as directed.  Do not stop taking the medicine without talking to your doctor first, even if you have not had a seizure in a long time.   2. Avoid activities in which a seizure would cause danger to yourself or to others.  Don't operate dangerous machinery, swim alone, or climb in high or dangerous places, such as on ladders, roofs, or girders.  Do not drive unless your doctor says you may.  3. If you have any warning that you may have a seizure, lay down in a safe place where you can't hurt yourself.    4.  No driving for 6 months from last seizure, as per [facility].   Please refer to the following link on the Epilepsy Foundation of [place]'s website for more information: http://www.epilepsyfoundation.org/answerplace/Social/driving/drivingu.cfm   5.  Maintain good sleep hygiene. Avoid alcohol.  6.  Contact your doctor if you have any problems that may be related to the medicine you are taking.  7.  Call 911 and bring the patient back to the ED if:        A.  The seizure lasts longer than 5 minutes.       B.  The patient doesn't awaken shortly after the seizure  C.  The patient has new problems such as difficulty seeing, speaking or moving  D.  The patient was injured during the seizure  E.  The patient has a temperature over 102 F (39C)  F.  The patient vomited and now is having trouble breathing

## 2023-03-06 ENCOUNTER — Other Ambulatory Visit: Payer: Self-pay

## 2023-03-25 ENCOUNTER — Telehealth: Payer: Self-pay | Admitting: Critical Care Medicine

## 2023-03-25 ENCOUNTER — Other Ambulatory Visit: Payer: Self-pay

## 2023-03-25 ENCOUNTER — Ambulatory Visit: Payer: Commercial Managed Care - HMO | Attending: Critical Care Medicine | Admitting: Critical Care Medicine

## 2023-03-25 ENCOUNTER — Encounter: Payer: Self-pay | Admitting: Critical Care Medicine

## 2023-03-25 VITALS — BP 116/77 | HR 60 | Temp 98.2°F | Ht 69.0 in | Wt 157.0 lb

## 2023-03-25 DIAGNOSIS — F330 Major depressive disorder, recurrent, mild: Secondary | ICD-10-CM | POA: Diagnosis not present

## 2023-03-25 DIAGNOSIS — G40802 Other epilepsy, not intractable, without status epilepticus: Secondary | ICD-10-CM | POA: Diagnosis not present

## 2023-03-25 MED ORDER — ESCITALOPRAM OXALATE 20 MG PO TABS
20.0000 mg | ORAL_TABLET | Freq: Every day | ORAL | 1 refills | Status: DC
  Filled 2023-03-25: qty 90, 90d supply, fill #0
  Filled 2023-04-06 (×2): qty 30, 30d supply, fill #0
  Filled 2023-05-07: qty 30, 30d supply, fill #1
  Filled 2023-06-02: qty 30, 30d supply, fill #2
  Filled 2023-07-06: qty 30, 30d supply, fill #3

## 2023-03-25 NOTE — Progress Notes (Signed)
Established Patient Office Visit  Subjective   Patient ID: Omar Robertson, male    DOB: 23-Apr-1981  Age: 42 y.o. MRN: [id number]  Chief Complaint  Patient presents with   Seizures    Seizure f/u.  Discuss medication dosage increase from neurologist - Keppra    11/20/22 This is a 42 year old male last seen by Dr. Delford Field in May 2023.  Patient has mixed hyperlipidemia recurrent depression and recurrent seizure disorder.  Patient was readmitted with recurrent seizures from the 26 and 28 February.  He had stopped his seizure medicines for about 3 days and then resumed.  On arrival blood pressure 105/63.  He has been followed up by neurology and is back on his seizure medications without dose change.  Note he does have poor dentition will need a dental appointment.  This visit was assisted by yamika [phone] Patient states since being on Lexapro his depression has improved.  He had hypokalemia at the last hospital visit he needs his potassium rechecked  Discharge summary as below Admit date: 10/27/2022 Discharge date: 10/29/2022     PCP: Storm Frisk, MD   DISCHARGE DIAGNOSES:    Seizures (HCC)   Epilepsy (HCC)   Mixed hyperlipidemia   TBI (traumatic brain injury) (HCC)   Depression     RECOMMENDATIONS FOR OUTPATIENT FOLLOW UP: 1. Patient to follow-up with his PCP     Home Health: None Equipment/Devices: None   CODE STATUS: Full code   DISCHARGE CONDITION: fair   Diet recommendation: As before   INITIAL HISTORY: This is a 42 year old Spanish-speaking male with past medical history of recurrent  ?intractable seizures (follows with neurology and is known to be noncompliant with medications), resected brain cyst, depression. Patient presented to the hospital again for worsening mental status noted seizures at home 3 the day prior to admission, 1 the morning of admission and 1 in the emergency department.  Neurology consulted and hospitalist called for admission.     Consultations:  Neurology     HOSPITAL COURSE:    Acute breakthrough seizure in the setting of known noncompliance Family confirms patient has been noncompliant with medications over the past few days due to family stressors.  Followed by Dr. Karel Jarvis in the outpatient setting.  Last seen towards the end of 2023. -Loaded with Keppra, Vimpat in the ED -Neurology following, appreciate insight recommendations -Continue Keppra 1000 mg twice daily, Vimpat 200 mg twice daily and Zonegran 500 mg at bedtime -Imaging negative thus far Patient mentation appears to have improved. Seems to be back to baseline.  He has refills for his antiepileptic medications at the community pharmacy.   Acute metabolic encephalopathy Postictal psychosis, recurrent -Likely secondary to prolonged postictal state and recurrent seizures -Imaging negative, infectious workup negative as above Seems to be close to baseline.   Profound medication noncompliance  -As above family confirms patient skips medications quite frequently, likely the etiology of his recurrent hospitalizations for seizures mental status changes and postictal psychosis. Compliance has been emphasized.   Hyperkalemia Now with low potassium level which will be supplemented prior to discharge.   Depression -Continue Lexapro   Mixed hyperlipidemia H/o colloid brain cyst excision and TBI -No medication changes or further workup indicated   Patient is stable.  Okay for discharge home today.  He has ambulated in the hallway without difficulty.   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  He is averaging seizures once a week.  He gets red in the face falls down does not have uncontrolled movements wakes up with no memory of the period of time.  He had 2 seizures since 3 July.  Recently saw neurology end of June documentation is as  below. 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 EEG showed rare spikes over the right frontopolar region. Last GTC was in 10/2022 after stopping medications, since then he has had 3 focal seizures with impaired awareness in the last 3 months with no clear triggers. Increase Levetiracetam to 1500mg  BID, continue Lacosamide 200mg  BID and Zonisamide 500mg  at bedtime. We may consider adding Xcopri on next visit, as well as referral to tertiary academic center for presurgical evaluation. He does not drive. Continue seizure calendar, follow-up in 3-4 months, call for any changes.    The patient states he is having some mental health challenges he is depressed as he cannot work.  He is not on any and depressants.  He has mild thoughts of suicide no specific plan.      Review of Systems  Constitutional:  Negative for chills, diaphoresis, fever, malaise/fatigue and weight loss.  HENT:  Negative for congestion, hearing loss, nosebleeds, sore throat and tinnitus.        Poor dentition  Eyes:  Negative for blurred vision, photophobia and redness.  Respiratory:  Negative for cough, hemoptysis, sputum production, shortness of breath, wheezing and stridor.   Cardiovascular:  Negative for chest pain, palpitations, orthopnea, claudication, leg swelling and PND.  Gastrointestinal:  Negative for abdominal pain, blood in stool, constipation, diarrhea, heartburn, nausea and vomiting.  Genitourinary:  Negative for dysuria, flank pain, frequency, hematuria and urgency.  Musculoskeletal:  Negative for back pain, falls, joint pain, myalgias and neck pain.  Skin:  Negative for  itching and rash.  Neurological:  Positive for seizures. Negative for dizziness, tingling, tremors, sensory change, speech change, focal weakness, loss of consciousness, weakness and headaches.  Endo/Heme/Allergies:  Negative for environmental allergies and polydipsia. Does not bruise/bleed easily.  Psychiatric/Behavioral:  Negative for depression, memory loss, substance abuse and suicidal ideas. The patient is not nervous/anxious and does not have insomnia.       Objective:     BP 116/77 (BP Location: Left Arm, Patient Position: Sitting, Cuff Size: Normal)   Pulse 60   Temp 98.2 F (36.8 C) (Oral)   Ht 5\' 9"  (1.753 m)   Wt 157 lb (71.2 kg)   SpO2 100%   BMI 23.18 kg/m    Physical Exam Vitals reviewed.  Constitutional:      Appearance: Normal appearance. He is well-developed. He is not diaphoretic.  HENT:     Head: Normocephalic and atraumatic.     Nose: No nasal deformity, septal deviation, mucosal edema or rhinorrhea.     Right Sinus: No maxillary sinus tenderness or frontal sinus tenderness.     Left Sinus: No maxillary sinus tenderness or frontal sinus tenderness.     Mouth/Throat:     Mouth: Mucous membranes are moist.     Pharynx: Oropharynx is clear. No oropharyngeal exudate.     Comments: Periodontal disease Eyes:  General: No scleral icterus.    Conjunctiva/sclera: Conjunctivae normal.     Pupils: Pupils are equal, round, and reactive to light.  Neck:     Thyroid: No thyromegaly.     Vascular: No carotid bruit or JVD.     Trachea: Trachea normal. No tracheal tenderness or tracheal deviation.  Cardiovascular:     Rate and Rhythm: Normal rate and regular rhythm.     Chest Wall: PMI is not displaced.     Pulses: Normal pulses. No decreased pulses.     Heart sounds: Normal heart sounds, S1 normal and S2 normal. Heart sounds not distant. No murmur heard.    No systolic murmur is present.     No diastolic murmur is present.     No friction rub. No gallop. No S3  or S4 sounds.  Pulmonary:     Effort: No tachypnea, accessory muscle usage or respiratory distress.     Breath sounds: No stridor. No decreased breath sounds, wheezing, rhonchi or rales.  Chest:     Chest wall: No tenderness.  Abdominal:     General: Bowel sounds are normal. There is no distension.     Palpations: Abdomen is soft. Abdomen is not rigid.     Tenderness: There is no abdominal tenderness. There is no guarding or rebound.  Musculoskeletal:        General: Normal range of motion.     Cervical back: Normal range of motion and neck supple. No edema, erythema or rigidity. No muscular tenderness. Normal range of motion.  Lymphadenopathy:     Head:     Right side of head: No submental or submandibular adenopathy.     Left side of head: No submental or submandibular adenopathy.     Cervical: No cervical adenopathy.  Skin:    General: Skin is warm and dry.     Coloration: Skin is not pale.     Findings: No rash.     Nails: There is no clubbing.  Neurological:     Mental Status: He is alert and oriented to person, place, and time.     Sensory: No sensory deficit.  Psychiatric:        Mood and Affect: Mood normal.        Speech: Speech normal.        Behavior: Behavior normal.        Thought Content: Thought content normal.        Judgment: Judgment normal.      No results found for any visits on 03/25/23.    The 10-year ASCVD risk score (Arnett DK, et al., 2019) is: 1.4%    Assessment & Plan:   Problem List Items Addressed This Visit       Nervous and Auditory   Epilepsy (HCC) - Primary    Continue higher dose of medications per neurology        Other   Mild episode of recurrent major depressive disorder (HCC)    Continue Lexapro refer to [facility] for therapy      Relevant Medications   escitalopram (LEXAPRO) 20 MG tablet     Return for primary care follow up, seizure.    Shan Levans, MD

## 2023-03-25 NOTE — Assessment & Plan Note (Signed)
Continue higher dose of medications per neurology

## 2023-03-25 NOTE — Patient Instructions (Signed)
No change in medications My behavioral therapist will call you   Return 3 months

## 2023-03-25 NOTE — Telephone Encounter (Signed)
See for depression mild thinking of suicide no plan call him please for wellness check he is on therapy

## 2023-03-25 NOTE — Assessment & Plan Note (Signed)
Continue Lexapro refer to [facility] for therapy

## 2023-04-02 ENCOUNTER — Other Ambulatory Visit: Payer: Self-pay

## 2023-04-06 ENCOUNTER — Other Ambulatory Visit: Payer: Self-pay

## 2023-04-07 ENCOUNTER — Other Ambulatory Visit: Payer: Self-pay

## 2023-04-29 ENCOUNTER — Telehealth: Payer: Self-pay | Admitting: Licensed Clinical Social Worker

## 2023-04-29 NOTE — Telephone Encounter (Signed)
[facility] called patient today for a follow up convo to introduce herself and to assess patients' mental health needs. Using interpreter [phone]. Patient was referred by PCP for depression. He states that he is still seeing his therapist and reports that he is doing "ok". No others concerns brought to my attention.

## 2023-05-07 ENCOUNTER — Other Ambulatory Visit: Payer: Self-pay

## 2023-05-08 ENCOUNTER — Other Ambulatory Visit: Payer: Self-pay

## 2023-06-02 ENCOUNTER — Other Ambulatory Visit: Payer: Self-pay

## 2023-06-03 ENCOUNTER — Other Ambulatory Visit: Payer: Self-pay

## 2023-06-16 ENCOUNTER — Encounter: Payer: Self-pay | Admitting: Neurology

## 2023-06-16 ENCOUNTER — Ambulatory Visit: Payer: Managed Care, Other (non HMO) | Admitting: Neurology

## 2023-06-16 DIAGNOSIS — Z029 Encounter for administrative examinations, unspecified: Secondary | ICD-10-CM

## 2023-07-06 ENCOUNTER — Other Ambulatory Visit: Payer: Self-pay

## 2023-07-07 ENCOUNTER — Other Ambulatory Visit: Payer: Self-pay

## 2023-07-27 NOTE — Progress Notes (Unsigned)
Established Patient Office Visit  Subjective   Patient ID: Omar Robertson, male    DOB: [birth year]  Age: 42 y.o. MRN: [id number]  No chief complaint on file.   11/20/22 This is a 42 year old male last seen by Dr. Delford Field in May 2023.  Patient has mixed hyperlipidemia recurrent depression and recurrent seizure disorder.  Patient was readmitted with recurrent seizures from the 26 and 28 February.  He had stopped his seizure medicines for about 3 days and then resumed.  On arrival blood pressure 105/63.  He has been followed up by neurology and is back on his seizure medications without dose change.  Note he does have poor dentition will need a dental appointment.  This visit was assisted by yamika [phone] Patient states since being on Lexapro his depression has improved.  He had hypokalemia at the last hospital visit he needs his potassium rechecked  Discharge summary as below Admit date: 10/27/2022 Discharge date: 10/29/2022     PCP: Storm Frisk, MD   DISCHARGE DIAGNOSES:    Seizures (HCC)   Epilepsy (HCC)   Mixed hyperlipidemia   TBI (traumatic brain injury) (HCC)   Depression     RECOMMENDATIONS FOR OUTPATIENT FOLLOW UP: 1. Patient to follow-up with his PCP     Home Health: None Equipment/Devices: None   CODE STATUS: Full code   DISCHARGE CONDITION: fair   Diet recommendation: As before   INITIAL HISTORY: This is a 42 year old Spanish-speaking male with past medical history of recurrent  ?intractable seizures (follows with neurology and is known to be noncompliant with medications), resected brain cyst, depression. Patient presented to the hospital again for worsening mental status noted seizures at home 3 the day prior to admission, 1 the morning of admission and 1 in the emergency department.  Neurology consulted and hospitalist called for admission.    Consultations:  Neurology     HOSPITAL COURSE:    Acute breakthrough seizure in the setting of  known noncompliance Family confirms patient has been noncompliant with medications over the past few days due to family stressors.  Followed by Dr. Karel Jarvis in the outpatient setting.  Last seen towards the end of 2023. -Loaded with Keppra, Vimpat in the ED -Neurology following, appreciate insight recommendations -Continue Keppra 1000 mg twice daily, Vimpat 200 mg twice daily and Zonegran 500 mg at bedtime -Imaging negative thus far Patient mentation appears to have improved. Seems to be back to baseline.  He has refills for his antiepileptic medications at the community pharmacy.   Acute metabolic encephalopathy Postictal psychosis, recurrent -Likely secondary to prolonged postictal state and recurrent seizures -Imaging negative, infectious workup negative as above Seems to be close to baseline.   Profound medication noncompliance  -As above family confirms patient skips medications quite frequently, likely the etiology of his recurrent hospitalizations for seizures mental status changes and postictal psychosis. Compliance has been emphasized.   Hyperkalemia Now with low potassium level which will be supplemented prior to discharge.   Depression -Continue Lexapro   Mixed hyperlipidemia H/o colloid brain cyst excision and TBI -No medication changes or further workup indicated   Patient is stable.  Okay for discharge home today.  He has ambulated in the hallway without difficulty.   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  He is averaging seizures once a week.  He gets red in the face falls down does  not have uncontrolled movements wakes up with no memory of the period of time.  He had 2 seizures since 3 July.  Recently saw neurology end of June documentation is as below. 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 EEG showed rare spikes over the right frontopolar region. Last GTC was in 10/2022 after stopping medications, since then he has had 3 focal seizures with impaired awareness in the last 3 months with no clear triggers. Increase Levetiracetam to 1500mg  BID, continue Lacosamide 200mg  BID and Zonisamide 500mg  at bedtime. We may consider adding Xcopri on next visit, as well as referral to tertiary academic center for presurgical evaluation. He does not drive. Continue seizure calendar, follow-up in 3-4 months, call for any changes.    The patient states he is having some mental health challenges he is depressed as he cannot work.  He is not on any and depressants.  He has mild thoughts of suicide no specific plan.  07/29/23      Review of Systems  Constitutional:  Negative for chills, diaphoresis, fever, malaise/fatigue and weight loss.  HENT:  Negative for congestion, hearing loss, nosebleeds, sore throat and tinnitus.        Poor dentition  Eyes:  Negative for blurred vision, photophobia and redness.  Respiratory:  Negative for cough, hemoptysis, sputum production, shortness of breath, wheezing and stridor.   Cardiovascular:  Negative for chest pain, palpitations, orthopnea, claudication, leg swelling and PND.  Gastrointestinal:  Negative for abdominal pain, blood in stool, constipation, diarrhea, heartburn, nausea and vomiting.  Genitourinary:  Negative for dysuria, flank pain, frequency, hematuria and urgency.  Musculoskeletal:  Negative for back pain, falls, joint pain, myalgias and neck pain.  Skin:  Negative for itching and rash.  Neurological:  Positive for seizures. Negative for dizziness, tingling,  tremors, sensory change, speech change, focal weakness, loss of consciousness, weakness and headaches.  Endo/Heme/Allergies:  Negative for environmental allergies and polydipsia. Does not bruise/bleed easily.  Psychiatric/Behavioral:  Negative for depression, memory loss, substance abuse and suicidal ideas. The patient is not nervous/anxious and does not have insomnia.       Objective:     There were no vitals taken for this visit.   Physical Exam Vitals reviewed.  Constitutional:      Appearance: Normal appearance. He is well-developed. He is not diaphoretic.  HENT:     Head: Normocephalic and atraumatic.     Nose: No nasal deformity, septal deviation, mucosal edema or rhinorrhea.     Right Sinus: No maxillary sinus tenderness or frontal sinus tenderness.     Left Sinus: No maxillary sinus tenderness or frontal sinus tenderness.     Mouth/Throat:     Mouth: Mucous membranes are moist.     Pharynx: Oropharynx is clear. No oropharyngeal exudate.     Comments: Periodontal disease Eyes:     General: No scleral icterus.    Conjunctiva/sclera: Conjunctivae normal.     Pupils: Pupils are equal, round, and reactive to light.  Neck:     Thyroid: No thyromegaly.     Vascular: No carotid bruit or JVD.     Trachea: Trachea normal. No tracheal tenderness or tracheal deviation.  Cardiovascular:     Rate and Rhythm: Normal rate and regular rhythm.     Chest Wall: PMI is not displaced.     Pulses: Normal pulses. No decreased pulses.     Heart sounds: Normal heart sounds, S1 normal and S2 normal. Heart sounds not distant. No murmur heard.    No systolic murmur is present.     No diastolic murmur is present.     No friction rub. No gallop. No S3 or S4 sounds.  Pulmonary:     Effort: No tachypnea, accessory muscle usage or respiratory distress.     Breath sounds: No stridor. No decreased breath sounds, wheezing, rhonchi or rales.  Chest:     Chest wall: No tenderness.  Abdominal:      General: Bowel sounds are normal. There is no distension.     Palpations: Abdomen is soft. Abdomen is not rigid.     Tenderness: There is no abdominal tenderness. There is no guarding or rebound.  Musculoskeletal:        General: Normal range of motion.     Cervical back: Normal range of motion and neck supple. No edema, erythema or rigidity. No muscular tenderness. Normal range of motion.  Lymphadenopathy:     Head:     Right side of head: No submental or submandibular adenopathy.     Left side of head: No submental or submandibular adenopathy.     Cervical: No cervical adenopathy.  Skin:    General: Skin is warm and dry.     Coloration: Skin is not pale.     Findings: No rash.     Nails: There is no clubbing.  Neurological:     Mental Status: He is alert and oriented to person, place, and time.     Sensory: No sensory deficit.  Psychiatric:        Mood and Affect: Mood normal.        Speech: Speech normal.        Behavior: Behavior normal.        Thought Content: Thought content normal.        Judgment: Judgment normal.     No results found for any visits on 07/29/23.    The 10-year ASCVD risk score (Arnett DK, et al., 2019) is: 1.4%    Assessment & Plan:   Problem List Items Addressed This Visit   None     No follow-ups on file.    Shan Levans, MD

## 2023-07-29 ENCOUNTER — Other Ambulatory Visit: Payer: Self-pay

## 2023-07-29 ENCOUNTER — Ambulatory Visit: Payer: Commercial Managed Care - HMO | Admitting: Physician Assistant

## 2023-07-29 ENCOUNTER — Ambulatory Visit: Payer: Commercial Managed Care - HMO | Attending: Critical Care Medicine | Admitting: Critical Care Medicine

## 2023-07-29 ENCOUNTER — Ambulatory Visit: Payer: Commercial Managed Care - HMO | Admitting: Critical Care Medicine

## 2023-07-29 ENCOUNTER — Encounter: Payer: Self-pay | Admitting: Critical Care Medicine

## 2023-07-29 VITALS — BP 118/77 | HR 65 | Temp 98.1°F | Ht 69.0 in | Wt 162.0 lb

## 2023-07-29 DIAGNOSIS — F330 Major depressive disorder, recurrent, mild: Secondary | ICD-10-CM

## 2023-07-29 DIAGNOSIS — G40219 Localization-related (focal) (partial) symptomatic epilepsy and epileptic syndromes with complex partial seizures, intractable, without status epilepticus: Secondary | ICD-10-CM | POA: Diagnosis not present

## 2023-07-29 DIAGNOSIS — Z23 Encounter for immunization: Secondary | ICD-10-CM | POA: Diagnosis not present

## 2023-07-29 DIAGNOSIS — G40802 Other epilepsy, not intractable, without status epilepticus: Secondary | ICD-10-CM

## 2023-07-29 MED ORDER — LEVETIRACETAM 1000 MG PO TABS
ORAL_TABLET | Freq: Two times a day (BID) | ORAL | 11 refills | Status: DC
  Filled 2023-07-29: qty 90, 30d supply, fill #0
  Filled 2023-09-03: qty 90, 30d supply, fill #1
  Filled 2023-10-05 (×2): qty 90, 30d supply, fill #2
  Filled 2023-11-02: qty 90, 30d supply, fill #3
  Filled 2023-11-30 (×2): qty 90, 30d supply, fill #4
  Filled 2023-12-29: qty 90, 30d supply, fill #5

## 2023-07-29 MED ORDER — ZONISAMIDE 100 MG PO CAPS
500.0000 mg | ORAL_CAPSULE | Freq: Every evening | ORAL | 11 refills | Status: DC
  Filled 2023-07-29: qty 150, 30d supply, fill #0
  Filled 2023-09-04: qty 150, 30d supply, fill #1
  Filled 2023-10-05 (×2): qty 150, 30d supply, fill #2
  Filled 2023-11-02: qty 150, 30d supply, fill #3
  Filled 2023-11-30 (×2): qty 150, 30d supply, fill #4
  Filled 2023-12-29: qty 150, 30d supply, fill #5

## 2023-07-29 MED ORDER — LACOSAMIDE 200 MG PO TABS
200.0000 mg | ORAL_TABLET | Freq: Two times a day (BID) | ORAL | 5 refills | Status: DC
  Filled 2023-07-29 – 2023-08-03 (×2): qty 60, 30d supply, fill #0
  Filled 2023-09-03: qty 60, 30d supply, fill #1
  Filled 2023-10-05: qty 60, 30d supply, fill #2
  Filled 2023-11-02: qty 60, 30d supply, fill #3
  Filled 2023-11-30 (×2): qty 60, 30d supply, fill #4
  Filled 2023-12-29: qty 60, 30d supply, fill #5

## 2023-07-29 MED ORDER — ESCITALOPRAM OXALATE 20 MG PO TABS
20.0000 mg | ORAL_TABLET | Freq: Every day | ORAL | 2 refills | Status: DC
  Filled 2023-07-29: qty 30, 30d supply, fill #0
  Filled 2023-09-03: qty 30, 30d supply, fill #1
  Filled 2023-10-05 (×2): qty 30, 30d supply, fill #2
  Filled 2023-11-02: qty 30, 30d supply, fill #3
  Filled 2023-11-30 (×2): qty 30, 30d supply, fill #4
  Filled 2023-12-29: qty 30, 30d supply, fill #5
  Filled 2024-01-27: qty 30, 30d supply, fill #6

## 2023-07-29 NOTE — Assessment & Plan Note (Addendum)
Recurrent seizure disorder managed by neurology will refill current program and check labs

## 2023-07-29 NOTE — Patient Instructions (Signed)
Medications refilled Labs today Return 6 months

## 2023-07-30 LAB — CBC WITH DIFFERENTIAL/PLATELET
Basophils Absolute: 0 10*3/uL (ref 0.0–0.2)
Basos: 1 %
EOS (ABSOLUTE): 0.2 10*3/uL (ref 0.0–0.4)
Eos: 2 %
Hematocrit: 43.7 % (ref 37.5–51.0)
Hemoglobin: 14.1 g/dL (ref 13.0–17.7)
Immature Grans (Abs): 0.1 10*3/uL (ref 0.0–0.1)
Immature Granulocytes: 1 %
Lymphocytes Absolute: 2.3 10*3/uL (ref 0.7–3.1)
Lymphs: 33 %
MCH: 27.6 pg (ref 26.6–33.0)
MCHC: 32.3 g/dL (ref 31.5–35.7)
MCV: 86 fL (ref 79–97)
Monocytes Absolute: 0.7 10*3/uL (ref 0.1–0.9)
Monocytes: 10 %
Neutrophils Absolute: 3.7 10*3/uL (ref 1.4–7.0)
Neutrophils: 53 %
Platelets: 284 10*3/uL (ref 150–450)
RBC: 5.11 x10E6/uL (ref 4.14–5.80)
RDW: 12.8 % (ref 11.6–15.4)
WBC: 6.9 10*3/uL (ref 3.4–10.8)

## 2023-07-30 LAB — COMPREHENSIVE METABOLIC PANEL
ALT: 23 [IU]/L (ref 0–44)
AST: 18 [IU]/L (ref 0–40)
Albumin: 4.6 g/dL (ref 4.1–5.1)
Alkaline Phosphatase: 70 [IU]/L (ref 44–121)
BUN/Creatinine Ratio: 18 (ref 9–20)
BUN: 20 mg/dL (ref 6–24)
Bilirubin Total: 0.2 mg/dL (ref 0.0–1.2)
CO2: 20 mmol/L (ref 20–29)
Calcium: 9.2 mg/dL (ref 8.7–10.2)
Chloride: 108 mmol/L — ABNORMAL HIGH (ref 96–106)
Creatinine, Ser: 1.09 mg/dL (ref 0.76–1.27)
Globulin, Total: 3 g/dL (ref 1.5–4.5)
Glucose: 93 mg/dL (ref 70–99)
Potassium: 4.4 mmol/L (ref 3.5–5.2)
Sodium: 143 mmol/L (ref 134–144)
Total Protein: 7.6 g/dL (ref 6.0–8.5)
eGFR: 86 mL/min/{1.73_m2} (ref >59)

## 2023-08-01 NOTE — Progress Notes (Signed)
Let pt know all labs normal

## 2023-08-03 ENCOUNTER — Other Ambulatory Visit: Payer: Self-pay

## 2023-08-04 ENCOUNTER — Other Ambulatory Visit: Payer: Self-pay

## 2023-08-05 ENCOUNTER — Telehealth: Payer: Self-pay

## 2023-08-05 NOTE — Telephone Encounter (Signed)
Pt was called and vm was left, Information has been sent to nurse pool.    Interpreter id Jonetta Speak [phone]

## 2023-08-05 NOTE — Telephone Encounter (Signed)
-----   Message from Shan Levans sent at 08/01/2023  8:42 AM EST ----- Let pt know all labs normal

## 2023-09-03 ENCOUNTER — Other Ambulatory Visit: Payer: Self-pay

## 2023-09-04 ENCOUNTER — Other Ambulatory Visit: Payer: Self-pay

## 2023-10-05 ENCOUNTER — Other Ambulatory Visit: Payer: Self-pay

## 2023-11-02 ENCOUNTER — Other Ambulatory Visit: Payer: Self-pay

## 2023-11-03 ENCOUNTER — Other Ambulatory Visit: Payer: Self-pay

## 2023-11-30 ENCOUNTER — Other Ambulatory Visit: Payer: Self-pay

## 2023-12-01 ENCOUNTER — Other Ambulatory Visit: Payer: Self-pay

## 2023-12-10 IMAGING — MR MR HEAD WO/W CM
11 of 16 series · 18 of 48 positions shown · IV contrast (gadavist)
Comparison: Head CT 07/12/2020

CLINICAL DATA: Seizures.

EXAM:
MRI HEAD WITHOUT AND WITH CONTRAST
TECHNIQUE: Multiplanar, multiecho pulse sequences of the brain and surrounding
structures were obtained without and with intravenous contrast.
CONTRAST:  8mL GADAVIST GADOBUTROL 1 MMOL/ML IV SOLN

[Series 2: DWI · axial · 3.0mm · 0.94mm/px · z∈[-145,+10]mm · 5 of 108 slices shown (1 of 2)]
[im 1/108]
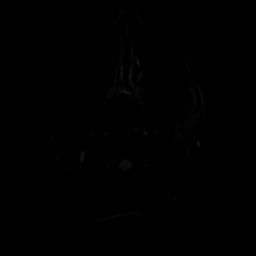
[im 27/108]
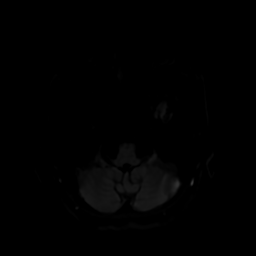
[im 54/108]
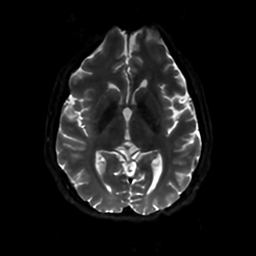
[im 81/108]
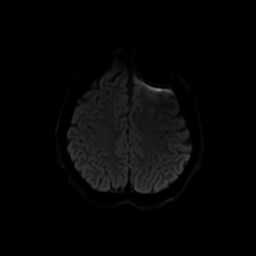
[im 108/108]
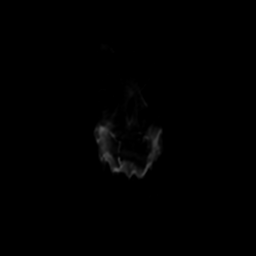

[Series 3: DWI · coronal · 4.0mm · 0.94mm/px · 3 of 78 slices shown (2 of 2)]
[im 1/78]
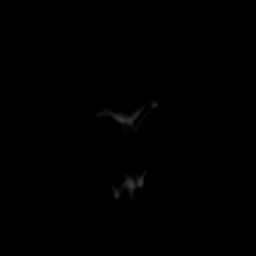
[im 39/78]
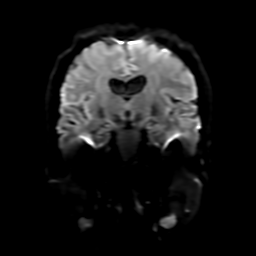
[im 78/78]
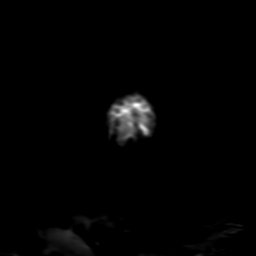

[Series 4: FLAIR · sagittal · 5.0mm · 0.23mm/px · 1 of 26 slices shown (1 of 3)]
[im 1/26]
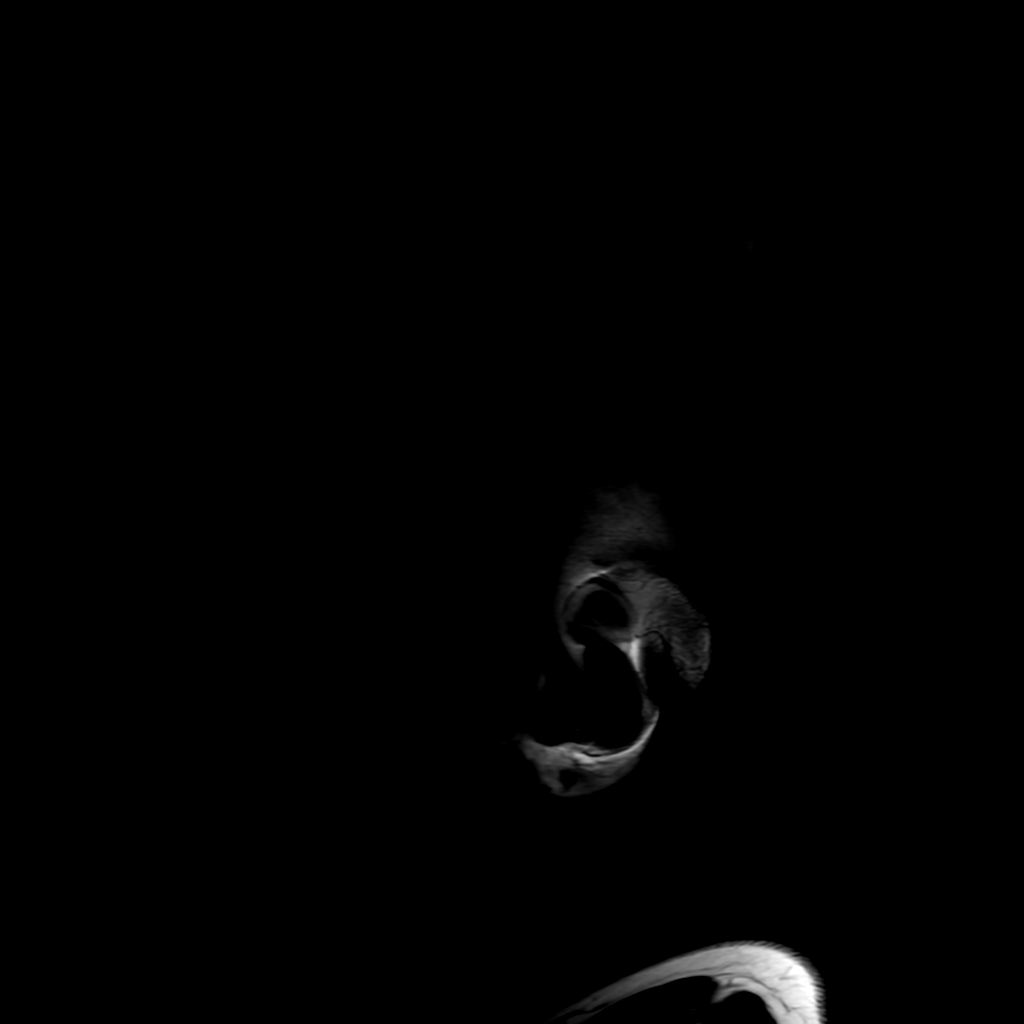

[Series 5: T2 · axial · 5.0mm · 0.23mm/px · 1 of 28 slices shown (1 of 2)]
[im 1/28]
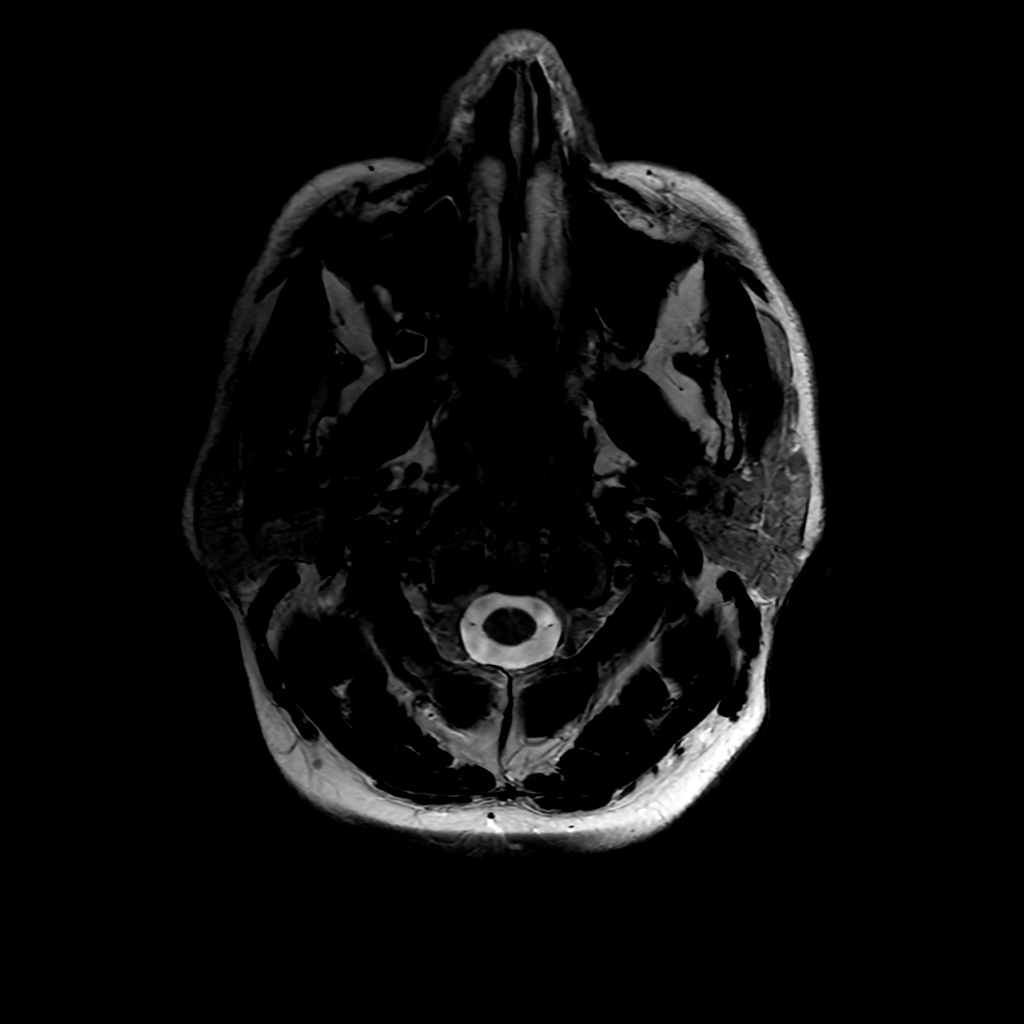

[Series 6: FLAIR · axial · 4.0mm · 0.45mm/px · 1 of 38 slices shown (2 of 3)]
[im 1/38]
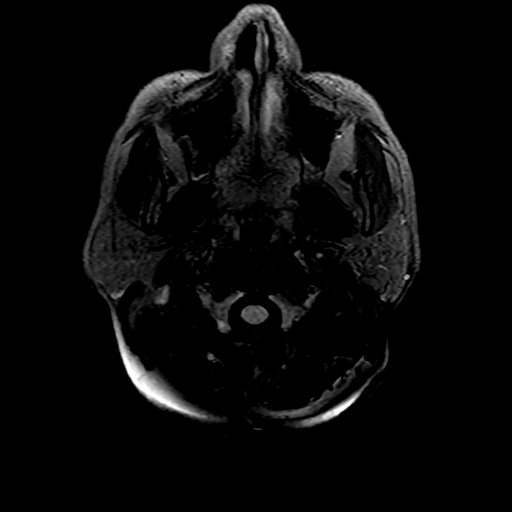

[Series 9: T2 · coronal · 3.0mm · 0.35mm/px · 1 of 26 slices shown (2 of 2)]
[im 1/26]
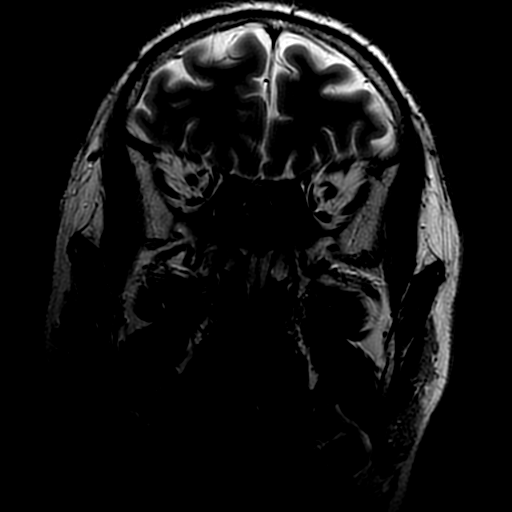

[Series 10: FLAIR · coronal · 3.0mm · 0.39mm/px · 1 of 32 slices shown (3 of 3)]
[im 1/32]
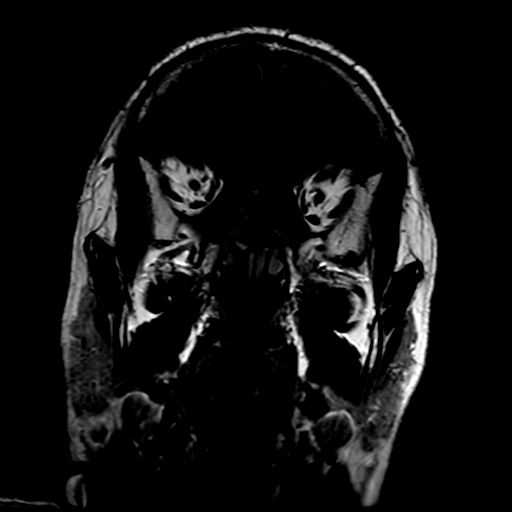

[Series 11: T2 post-contrast · coronal · 5.0mm · 0.20mm/px · 1 of 31 slices shown]
[im 1/31]
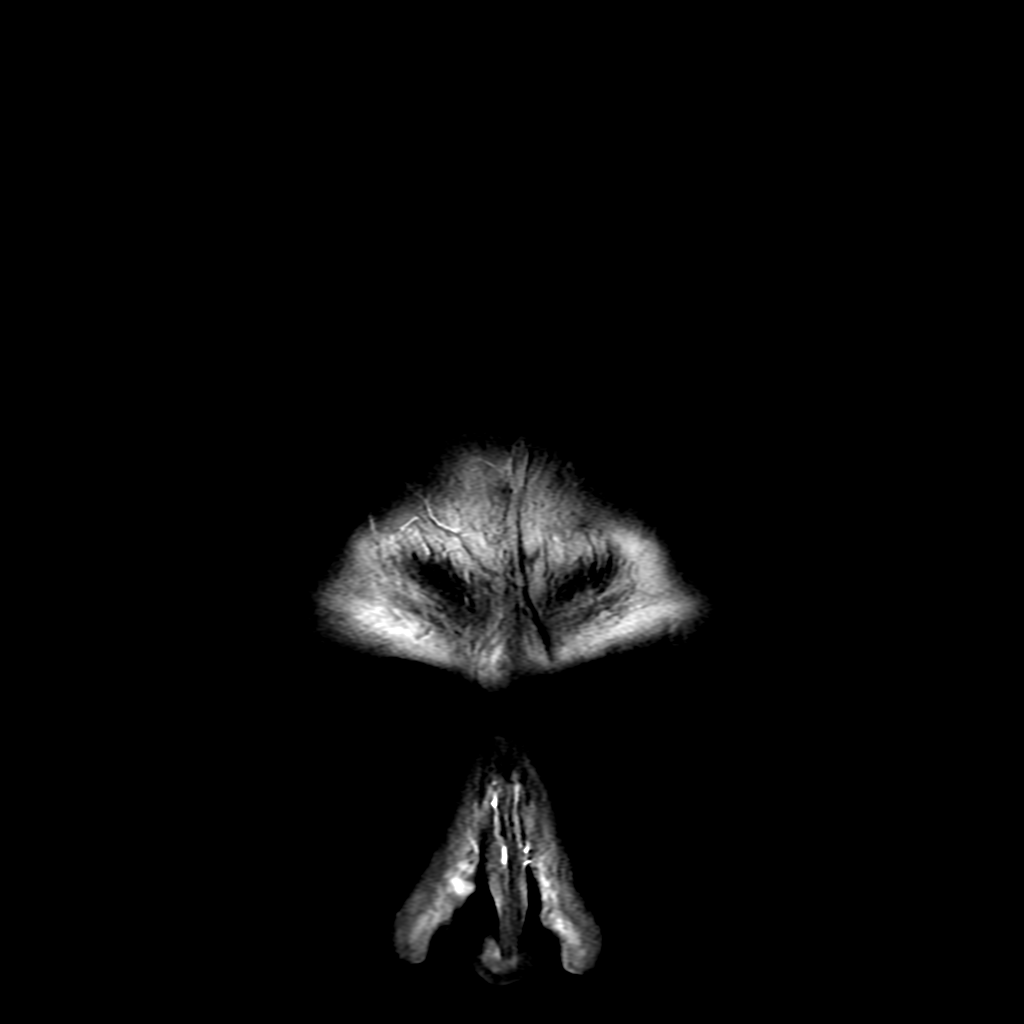

[Series 12: T1 post-contrast · coronal · 5.0mm · 0.47mm/px · 1 of 32 slices shown]
[im 1/32]
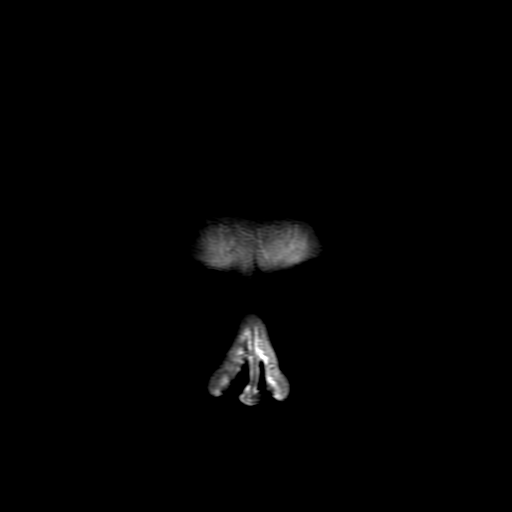

[Series 250: ADC · axial · 3.0mm · 0.94mm/px · z∈[-145,+10]mm · 2 of 54 slices shown (1 of 2)]
[im 1/54]
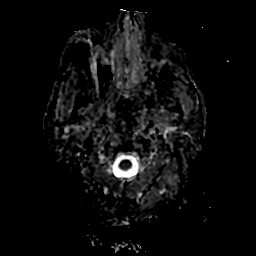
[im 54/54]
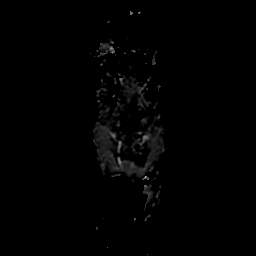

[Series 350: ADC · coronal · 4.0mm · 0.94mm/px · 1 of 36 slices shown (2 of 2)]
[im 1/36]
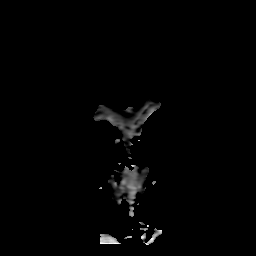

[18 of 48 positions shown; findings below may reference images not displayed]

FINDINGS: Brain: There is no evidence of an acute infarct, midline shift, or
extra-axial fluid collection. Encephalomalacia is present in the
right greater than left frontal lobes subjacent to a craniotomy and
right frontal burr hole, and there is a small amount hemosiderin
deposition in the right frontal lobe. There is also cystic
encephalomalacia involving the anterior body of the corpus callosum,
right greater than left. A 6 mm colloid cyst is again noted at the
foramina of [place].

The ventricles are unchanged in size, with note made of mildly age
advanced cerebral and moderately advanced cerebellar atrophy. No
abnormal enhancement is identified. Dedicated imaging of the
temporal lobes demonstrates asymmetrically decreased volume and T2
hyperintensity diffusely of the right hippocampus compared to the
left.

Vascular: Major intracranial vascular flow voids are preserved.

Skull and upper cervical spine: Postsurgical changes. No suspicious
marrow lesion.

Sinuses/Orbits: Unremarkable orbits. Minimal mucosal thickening in
the paranasal sinuses. Clear mastoid air cells.

Other: None.
IMPRESSION: 1. No acute intracranial abnormality.
2. Encephalomalacia in the frontal lobes and corpus callosum.
3. Asymmetric volume loss and T2 hyperintensity of the right
hippocampus which may reflect mesial temporal sclerosis.
4. Moderately advanced cerebellar atrophy.
5. Unchanged 6 mm colloid cyst without hydrocephalus.

## 2023-12-29 ENCOUNTER — Other Ambulatory Visit: Payer: Self-pay

## 2023-12-30 ENCOUNTER — Other Ambulatory Visit: Payer: Self-pay

## 2024-01-20 ENCOUNTER — Other Ambulatory Visit: Payer: Self-pay

## 2024-01-20 ENCOUNTER — Ambulatory Visit: Payer: Commercial Managed Care - HMO | Admitting: Neurology

## 2024-01-20 ENCOUNTER — Encounter: Payer: Self-pay | Admitting: Neurology

## 2024-01-20 VITALS — BP 114/79 | HR 69 | Wt 172.0 lb

## 2024-01-20 DIAGNOSIS — G40219 Localization-related (focal) (partial) symptomatic epilepsy and epileptic syndromes with complex partial seizures, intractable, without status epilepticus: Secondary | ICD-10-CM

## 2024-01-20 MED ORDER — ZONISAMIDE 100 MG PO CAPS
500.0000 mg | ORAL_CAPSULE | Freq: Every evening | ORAL | 11 refills | Status: AC
  Filled 2024-01-20 – 2024-01-27 (×2): qty 150, 30d supply, fill #0
  Filled 2024-03-01: qty 150, 30d supply, fill #1
  Filled 2024-03-31: qty 150, 30d supply, fill #2
  Filled 2024-04-29: qty 150, 30d supply, fill #3
  Filled 2024-05-31: qty 150, 30d supply, fill #4
  Filled 2024-07-02: qty 150, 30d supply, fill #5
  Filled 2024-07-04 – 2024-07-29 (×2): qty 150, 30d supply, fill #6
  Filled 2024-08-29: qty 150, 30d supply, fill #7
  Filled 2024-09-28: qty 150, 30d supply, fill #8

## 2024-01-20 MED ORDER — LACOSAMIDE 200 MG PO TABS
200.0000 mg | ORAL_TABLET | Freq: Two times a day (BID) | ORAL | 5 refills | Status: DC
  Filled 2024-01-20 – 2024-01-27 (×2): qty 60, 30d supply, fill #0
  Filled 2024-03-01: qty 60, 30d supply, fill #1
  Filled 2024-03-31: qty 60, 30d supply, fill #2
  Filled 2024-04-29: qty 60, 30d supply, fill #3
  Filled 2024-05-31: qty 60, 30d supply, fill #4
  Filled 2024-07-04: qty 60, 30d supply, fill #5

## 2024-01-20 MED ORDER — LEVETIRACETAM 1000 MG PO TABS
ORAL_TABLET | ORAL | 11 refills | Status: AC
  Filled 2024-01-20 – 2024-01-27 (×2): qty 120, 30d supply, fill #0
  Filled 2024-03-01 (×2): qty 120, 30d supply, fill #1
  Filled 2024-03-31: qty 120, 30d supply, fill #2
  Filled 2024-04-29: qty 120, 30d supply, fill #3
  Filled 2024-05-31: qty 120, 30d supply, fill #4
  Filled 2024-07-02 – 2024-07-04 (×2): qty 120, 30d supply, fill #5
  Filled 2024-07-29: qty 120, 30d supply, fill #6
  Filled 2024-08-29: qty 120, 30d supply, fill #7
  Filled 2024-09-28: qty 120, 30d supply, fill #8

## 2024-01-20 NOTE — Progress Notes (Signed)
 NEUROLOGY FOLLOW UP OFFICE NOTE  Omar Robertson [id number] [birth year]  HISTORY OF PRESENT ILLNESS: I had the pleasure of seeing Omar Robertson in follow-up in the neurology clinic on 01/20/2024.  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 He was lost to follow-up and presents today with his wife to help supplement the history. A Spanish medical interpreter [name] helps with the visit. Records and images were personally reviewed where available.  Since his last visit, Levetiracetam  was increased by his PCP to 1500mg  BID (refilled 07/2023). He is also on Lacosamide  200mg  BID and Zonisamide  500mg  at bedtime without side effects. His wife reports an average of 2 focal impaired awareness seizures a month, but states they are "very soft," like he is not feeling almost anything. In the past, he would be very aware something is happening, but recently he does not have that sensation, he is amnestic of events and his wife would alert him he had one. The last seizure was 20 days ago. He was sitting on the chair then looking at his wife like he wanted to say something but could not, his head turned to the right with behavioral arrest for 3 minutes. He then stood up and was confused to 15 minutes. She noticed he has seizures when he has sad news or something very happy occurs like a birthday. He denies any headaches, dizziness, focal numbness/tingling/weakness, no falls. Sleep and mood are good.   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 They report seizures started around age 43, he was hospitalized and the cysts were found. Surgery was done on the 2 cysts, they were told the 3rd cyst was not amenable to surgery. Records unavailable for review. 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 He describes seizures as drifting off in his mind, "thinks of something else," then does not know what happens. [name] has witnessed seizures where his face turns red, he would be drooling, unresponsive, then fall. Sometimes he would have brief shaking lasting for 1 minute. He used to bite his tongue. He would be confused for 15 minutes, then would have difficulty sleeping that night, awake the whole night until the next day. No focal weakness but [name] notes he has "some kind of discomfort" after the seizures. He has around 2 seizures a month, brought on "by surprise that caused him a big emotion." Last seizure was 08/28/21. They report he was tried on different unrecalled medications in the past but continued to have seizures. He has been on Levetiracetam  for many years, when they moved to [place], Vimpat  was started 3 years ago. He was on Vimpat  200mg  BID and Levetiracetam  1000mg  BID, however during hospital admission for seizures in 12/2020 (in setting of running out of medication), he was discharged on Vimpat  100mg  BID, notes indicate "Per pharmacist, cost of vimpat  may be prohibitive for pt. Would discharge on slightly lower dose which would be fully covered and have pt f/u with [facility] per [facility] documentation for further refills." His son reports an increase in seizures since reduction of dose.   He denies any olfactory/gustatory hallucinations, deja vu, rising epigastric sensation, focal numbness/tingling/weakness, myoclonic jerks. He denies any headaches, dizziness,  vision changes, no falls. He forgets a lot of things, they report a lot of memory loss where he could not remember conversations. He is currently unemployed and not driving.   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 He reports that  he fell and injured [place] a [place] he had stitches but was told "there is still blood in the brain." Maternal cousin has seizures. He had a normal birth and early development.  There is no history of febrile convulsions, CNS infections such as meningitis/encephalitis.  Diagnostic Data: EEG 12/2021: 1-hour awake and asleep EEG is abnormal due to the presence of rare spikes over the right frontopolar region.   MRI brain with and without contrast 01/2022: no acute changes, there is encephalomalacia in the right greater than left frontal lobes subjacent to a craniotomy and right frontal burr hole, small amount hemosiderin deposition in the right frontal lobe; there is also cystic encephalomalacia involving the anterior body of the corpus callosum, right greater than left; a 6mm colloid cyst is again noted at the foramina of [place]. 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   PAST MEDICAL HISTORY: Past Medical History:  Diagnosis Date   Depression    Epilepsy (HCC)    Pneumothorax 01/28/2021   Seizure (HCC)    Seizures (HCC)    Traumatic brain injury with loss of consciousness (HCC) 01/22/2021    MEDICATIONS: Current Outpatient Medications on File Prior to Visit  Medication Sig Dispense Refill   escitalopram  (LEXAPRO ) 20 MG tablet Take 1 tablet (20 mg total) by mouth daily. 90 tablet 2   lacosamide  (VIMPAT ) 200 MG TABS tablet Take 1 tablet (200 mg total) by mouth 2 (two) times daily. 60 tablet 5   levETIRAcetam  (KEPPRA ) 1000 MG tablet Take 1 and 1/2 tablets twice a day. 90 tablet 11   zonisamide  (ZONEGRAN ) 100 MG capsule Take 5 capsules (500 mg total) by mouth Nightly. 150 capsule 11   No current facility-administered medications on file prior to visit.    ALLERGIES: No Known Allergies  FAMILY HISTORY: Family History  Problem  Relation Age of Onset   Seizures Neg Hx     SOCIAL HISTORY: Social History   Socioeconomic History   Marital status: Media planner    Spouse name: Not on file   Number of children: Not on file   Years of education: Not on file   Highest education level: Not on file  Occupational History   Not on file  Tobacco Use   Smoking status: Never   Smokeless tobacco: Never  Vaping Use   Vaping status: Never Used  Substance and Sexual Activity   Alcohol use: Not Currently   Drug use: Not Currently   Sexual activity: Not Currently  Other Topics Concern   Not on file  Social History Narrative   Right handed    Drinks no caffeine    Social Drivers of Health   Financial Resource Strain: Not on file  Food Insecurity: Not on file  Transportation Needs: Not on file  Physical Activity: Not on file  Stress: Not on file  Social Connections: Not on file  Intimate Partner Violence: Not on file     PHYSICAL EXAM: Vitals:   01/20/24 0947  BP: 114/79  Pulse: 69  SpO2: 98%   General: No acute distress Head:  Normocephalic/atraumatic, pterygium on medial side of both eyes  Skin/Extremities: No rash, no edema Neurological Exam: alert and awake. No aphasia or dysarthria. Fund of knowledge is appropriate.  Attention and concentration are normal.   Cranial nerves: Pupils equal, round. Extraocular movements intact. No facial asymmetry.  Motor: moves all extremities symmetrically at least anti-gravity x 4. Gait narrow-based and steady, no ataxia.   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 EEG showed rare spikes over the right frontopolar region. No GTCs since  10/2022 however he continues to have focal impaired awareness seizures, most recently 3 weeks ago. He is on 3 ASMs, we discussed increasing Levetiracetam  to 2000mg  BID, continue Lacosamide  200mg  BID and Zonisamide  500mg  at bedtime. We discussed referral to a tertiary academic center for presurgical evaluation, they report EMU monitoring and 2 surgeries in [facility], records unavailable for review. They are agreeable to referral to [facility]. He does not drive. Follow-up in 4 months, call for any changes.    Thank you for allowing me to participate in his care.  Please do not hesitate to call for any questions or concerns.    Rayfield Cairo, M.D.   CC: Dr. Lincoln Renshaw

## 2024-01-20 NOTE — Patient Instructions (Addendum)
 Me alegra verte.  1. Continuar con Levetiracetam  1000 mg: tomar 2 comprimidos por la maana y 2 comprimidos por la tarde.  2. Continuar con Lacosamida 200 mg [employer].  3. Continuar con Zonisamida 100 mg: tomar 5 cpsulas cada noche.  4. Se enviar una derivacin a la [employer] para hablar sobre la opcin de [place].  5. Continuar con el calendario de convulsiones, realizar un seguimiento en 4 meses y Health visitor cambio.  Precauciones ante convulsiones: 1. Si le han recetado medicamentos para prevenir convulsiones, tmelos exactamente como se indica. No deje de tomar el medicamento sin consultar primero con su mdico, incluso si no ha tenido una convulsin en mucho tiempo.  2. Evite actividades en las que una convulsin pueda suponer un peligro para usted o para los dems. No opere maquinaria peligrosa, nade solo ni suba a lugares altos o peligrosos, como escaleras, techos o vigas. No conduzca a menos que su mdico se lo indique.  3. Si tiene [employer] de que podra tener una convulsin, recustese en un lugar seguro donde no pueda hacerse dao.  4. No conduzca durante 6 meses desde la ltima convulsin, segn la ley estatal de [place]. Para ms informacin, consulte el siguiente enlace en el sitio web de la Fundacin para la Epilepsia de [place]: http://www.epilepsyfoundation.org/answerplace/Social/driving/drivingu.cfm  5. Mantenga una buena higiene del sueo. Evite el alcohol.  6. Consulte a su mdico si tiene algn problema que pueda estar relacionado con el medicamento que est tomando.  7. Llame al 911 y lleve al paciente de vuelta a urgencias si:  A. La convulsin dura ms de 5 minutos. B. El paciente no despierta poco despus de la convulsin. C. El paciente presenta nuevos problemas, como dificultad para ver, hablar o moverse. D. El paciente sufri una lesin durante la convulsin. E. El paciente tiene una temperatura superior a 39 C (102  F). F. El paciente vomit y ahora tiene dificultad para Industrial/product designer.    Good to see you.  Continue Levetiracetam  1000mg : take 2 tablets in AM, 2 tablets in PM  2. Continue Lacosamide  200mg  twice a day  3. Continue Zonisamide  100mg : take 5 capsules every night  4. Continue seizure calendar, follow-up in 4 months, call for any changes   Seizure Precautions: 1. If medication has been prescribed for you to prevent seizures, take it exactly as directed.  Do not stop taking the medicine without talking to your doctor first, even if you have not had a seizure in a long time.   2. Avoid activities in which a seizure would cause danger to yourself or to others.  Don't operate dangerous machinery, swim alone, or climb in high or dangerous places, such as on ladders, roofs, or girders.  Do not drive unless your doctor says you may.  3. If you have any warning that you may have a seizure, lay down in a safe place where you can't hurt yourself.    4.  No driving for 6 months from last seizure, as per [place]  state law.   Please refer to the following link on the Epilepsy Foundation of America's website for more information: http://www.epilepsyfoundation.org/answerplace/Social/driving/drivingu.cfm   5.  Maintain good sleep hygiene. Avoid alcohol.  6.  Contact your doctor if you have any problems that may be related to the medicine you are taking.  7.  Call 911 and bring the patient back to the ED if:        A.  The seizure  lasts longer than 5 minutes.       B.  The patient doesn't awaken shortly after the seizure  C.  The patient has new problems such as difficulty seeing, speaking or moving  D.  The patient was injured during the seizure  E.  The patient has a temperature over 102 F (39C)  F.  The patient vomited and now is having trouble breathing

## 2024-01-27 ENCOUNTER — Other Ambulatory Visit: Payer: Self-pay

## 2024-01-28 ENCOUNTER — Encounter: Payer: Self-pay | Admitting: Internal Medicine

## 2024-01-28 ENCOUNTER — Ambulatory Visit: Payer: Commercial Managed Care - HMO | Attending: Internal Medicine | Admitting: Internal Medicine

## 2024-01-28 ENCOUNTER — Other Ambulatory Visit: Payer: Self-pay

## 2024-01-28 VITALS — BP 115/78 | HR 65 | Temp 98.1°F | Ht 69.0 in | Wt 170.0 lb

## 2024-01-28 DIAGNOSIS — F330 Major depressive disorder, recurrent, mild: Secondary | ICD-10-CM

## 2024-01-28 DIAGNOSIS — E782 Mixed hyperlipidemia: Secondary | ICD-10-CM

## 2024-01-28 DIAGNOSIS — G40219 Localization-related (focal) (partial) symptomatic epilepsy and epileptic syndromes with complex partial seizures, intractable, without status epilepticus: Secondary | ICD-10-CM | POA: Diagnosis not present

## 2024-01-28 MED ORDER — ESCITALOPRAM OXALATE 20 MG PO TABS
20.0000 mg | ORAL_TABLET | Freq: Every day | ORAL | 2 refills | Status: AC
  Filled 2024-03-01: qty 30, 30d supply, fill #0
  Filled 2024-03-31: qty 30, 30d supply, fill #1
  Filled 2024-04-29: qty 30, 30d supply, fill #2
  Filled 2024-05-31: qty 30, 30d supply, fill #3
  Filled 2024-07-02 – 2024-07-04 (×2): qty 30, 30d supply, fill #4
  Filled 2024-07-29: qty 30, 30d supply, fill #5
  Filled 2024-08-29: qty 30, 30d supply, fill #6
  Filled 2024-09-28: qty 30, 30d supply, fill #7

## 2024-01-28 NOTE — Progress Notes (Signed)
 Patient ID: Omar Robertson, male    DOB: 06-12-1981  MRN: [id number]  CC: Establish Care (Est care (Dr.Wright's pt) - Seizures. Med refill./No questions / concerns)   Subjective: Omar Robertson is a 43 y.o. male who presents for chronic ds management.  Previous PC P is Dr. Brent Cambric who has retired. 18 yr oled son, Omar Robertson,  is with him. His concerns today include:  Pt with hx of HL, Sz disorder, TBI, MDD, 3 brain cyst removed in [place]  [employer] Language interpreter used during this encounter. #Francisco [id number]  Sz ds;  followed by Dr.Aquino whom he saw earlier this mth and Keppra  dose increased from 1500 mg BID to 2000 mg twice a day due to reports of some break through sz Reports compliance with Keppra  1500 mg BID (will be starting the 2000 mg twice a day today; will pick up this dose today),  Zonisamide  500 mg at bedtime, Vimpat  200 mg BID.  Had sz 2 days ago.   MDD:  on Lexapro .  Feels that the depression is controlled on this.   Hx of mild hyperlipidemia that has improved on lipid profile done about 1 yr ago with LDL going from 132 to 103. 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 60 tablet 5   levETIRAcetam  (KEPPRA ) 1000 MG tablet Take 2 tablets (2,000 mg total) by mouth in the morning AND 2 tablets (2,000 mg total) every evening. 120 tablet 11   zonisamide  (ZONEGRAN ) 100 MG capsule Take 5 capsules (500 mg total) by mouth Nightly. 150  capsule 11   No current facility-administered medications on file prior to visit.    No Known Allergies  Social History   Socioeconomic History   Marital status: Media planner    Spouse name: Not on file   Number of children: Not on file   Years of education: Not on file   Highest education level: Not on file  Occupational History   Not on file  Tobacco Use   Smoking status: Never   Smokeless tobacco: Never  Vaping Use   Vaping status: Never Used  Substance and Sexual Activity   Alcohol use: Not Currently   Drug use: Not Currently   Sexual activity: Not Currently  Other Topics Concern   Not on file  Social History Narrative   Right handed    Drinks no caffeine    Social Drivers of Corporate investment banker Strain: Not on file  Food Insecurity: Not on file  Transportation Needs: Not on file  Physical Activity: Not on file  Stress: Not on file  Social Connections: Not on file  Intimate Partner Violence: Not on file    Family History  Problem Relation Age of Onset   Seizures Neg Hx     Past Surgical History:  Procedure Laterality Date   BRAIN SURGERY      ROS: Review of Systems Negative except as stated above  PHYSICAL EXAM: BP 115/78 (BP Location: Left Arm, Patient Position: Sitting, Cuff Size: Normal)   Pulse 65   Temp 98.1 F (36.7 C) (Oral)   Ht 5\' 9"  (1.753 m)   Wt 170 lb (77.1 kg)   SpO2 100%   BMI 25.10 kg/m   Physical Exam   General appearance - alert, well appearing, middle-aged Hispanic male and in no distress Mental status - normal mood, behavior, speech, dress, motor activity, and thought processes Chest - clear to auscultation, no wheezes, rales or rhonchi, symmetric air entry Heart - normal rate, regular rhythm, normal S1, S2, no murmurs, rubs, clicks or gallops Extremities - peripheral pulses normal, no pedal edema, no clubbing or cyanosis     07/29/2023    9:06 AM 03/25/2023    9:08 AM 11/20/2022   10:11 AM  Depression  screen PHQ 2/9  Decreased Interest 0 1 2  Down, Depressed, Hopeless 0 1 1  PHQ - 2 Score 0 2 3  Altered sleeping 0 1 2  Tired, decreased energy 0 1 2  Change in appetite 2 1 3   Feeling bad or failure about yourself  0 2 0  Trouble concentrating 0 1 2  Moving slowly or fidgety/restless 0 1 0  Suicidal thoughts 0 1 0  PHQ-9 Score 2 10 12   Difficult doing work/chores Not difficult at all         Latest Ref Rng & Units 07/29/2023    9:54 AM 11/20/2022   10:51 AM 10/29/2022    3:45 AM  CMP  Glucose 70 - 99 mg/dL 93  93  92   BUN 6 - 24 mg/dL 20  22  12    Creatinine 0.76 - 1.27 mg/dL 6.04  5.40  9.81   Sodium 134 - 144 mmol/L 143  143  141   Potassium 3.5 - 5.2 mmol/L 4.4  4.9  3.2   Chloride 96 - 106 mmol/L 108  110  109   CO2 20 - 29 mmol/L 20  20  21    Calcium  8.7 - 10.2 mg/dL 9.2  9.5  9.0   Total Protein 6.0 - 8.5 g/dL 7.6     Total Bilirubin 0.0 - 1.2 mg/dL 0.2     Alkaline Phos 44 - 121 IU/L 70     AST 0 - 40 IU/L 18     ALT 0 - 44 IU/L 23      Lipid Panel     Component Value Date/Time   CHOL 160 06/25/2022 1141   TRIG 118 06/25/2022 1141   HDL 36 (L) 06/25/2022 1141   CHOLHDL 4.4 06/25/2022 1141   LDLCALC 103 (H) 06/25/2022 1141    CBC    Component Value Date/Time   WBC 6.9 07/29/2023 0954   WBC 8.0 10/29/2022 0345   RBC 5.11 07/29/2023 0954   RBC 4.28 10/29/2022 0345   HGB 14.1 07/29/2023 0954   HCT 43.7 07/29/2023 0954   PLT 284 07/29/2023 0954   MCV 86 07/29/2023 0954   MCH 27.6 07/29/2023 0954   MCH 27.8 10/29/2022 0345   MCHC 32.3 07/29/2023 0954   MCHC 32.9 10/29/2022 0345   RDW 12.8 07/29/2023 0954   LYMPHSABS 2.3 07/29/2023 0954   MONOABS 0.9 10/28/2022 0020   EOSABS 0.2 07/29/2023 0954   BASOSABS 0.0 07/29/2023 0954    ASSESSMENT AND PLAN: 1. Localization-related (focal) (partial) symptomatic epilepsy and epileptic syndromes with complex partial seizures, intractable, without status epilepticus (HCC) (Primary) Plugged in with neurology.  Reports recent seizure after seeing his neurologist who had increased the dose of the Keppra .  However he has not picked up the increased dose as yet.  Will get it today.  Otherwise I stressed the importance of taking his seizure medications as prescribed by neurology.  His medications are Keppra  200 mg twice a day, zonisamide  500 mg at bedtime, Vimpat  200 mg twice a day. - CBC - Comprehensive metabolic panel with GFR  2. Mild episode of recurrent major depressive disorder (HCC) Stable on Lexapro . - escitalopram  (LEXAPRO ) 20 MG tablet; Take 1 tablet (20 mg total) by mouth daily.  Dispense: 90 tablet; Refill: 2  3. Mixed hyperlipidemia - Lipid panel   Patient was given the opportunity to ask questions.  Patient verbalized understanding of the plan and was able to repeat key elements of the plan.   This documentation was completed using Paediatric nurse.  Any transcriptional errors are unintentional.  Orders Placed This Encounter  Procedures   CBC   Comprehensive metabolic panel with GFR   Lipid panel     Requested Prescriptions   Signed Prescriptions Disp Refills   escitalopram  (LEXAPRO ) 20 MG tablet 90 tablet 2    Sig: Take 1 tablet (20 mg total) by mouth daily.    Return in about 6 months (around 07/30/2024).  Concetta Dee, MD, FACP

## 2024-01-29 ENCOUNTER — Ambulatory Visit: Payer: Self-pay | Admitting: Internal Medicine

## 2024-01-29 LAB — CBC
Hematocrit: 44.2 % (ref 37.5–51.0)
Hemoglobin: 14.1 g/dL (ref 13.0–17.7)
MCH: 27.6 pg (ref 26.6–33.0)
MCHC: 31.9 g/dL (ref 31.5–35.7)
MCV: 87 fL (ref 79–97)
Platelets: 280 10*3/uL (ref 150–450)
RBC: 5.1 x10E6/uL (ref 4.14–5.80)
RDW: 12.9 % (ref 11.6–15.4)
WBC: 6.6 10*3/uL (ref 3.4–10.8)

## 2024-01-29 LAB — COMPREHENSIVE METABOLIC PANEL WITH GFR
ALT: 22 IU/L (ref 0–44)
AST: 16 IU/L (ref 0–40)
Albumin: 4.5 g/dL (ref 4.1–5.1)
Alkaline Phosphatase: 70 IU/L (ref 44–121)
BUN/Creatinine Ratio: 15 (ref 9–20)
BUN: 18 mg/dL (ref 6–24)
Bilirubin Total: 0.3 mg/dL (ref 0.0–1.2)
CO2: 17 mmol/L — ABNORMAL LOW (ref 20–29)
Calcium: 9.2 mg/dL (ref 8.7–10.2)
Chloride: 108 mmol/L — ABNORMAL HIGH (ref 96–106)
Creatinine, Ser: 1.17 mg/dL (ref 0.76–1.27)
Globulin, Total: 2.9 g/dL (ref 1.5–4.5)
Glucose: 100 mg/dL — ABNORMAL HIGH (ref 70–99)
Potassium: 4.1 mmol/L (ref 3.5–5.2)
Sodium: 143 mmol/L (ref 134–144)
Total Protein: 7.4 g/dL (ref 6.0–8.5)
eGFR: 79 mL/min/{1.73_m2} (ref >59)

## 2024-01-29 LAB — LIPID PANEL
Chol/HDL Ratio: 6.2 ratio — ABNORMAL HIGH (ref 0.0–5.0)
Cholesterol, Total: 205 mg/dL — ABNORMAL HIGH (ref 100–199)
HDL: 33 mg/dL — ABNORMAL LOW (ref >39)
LDL Chol Calc (NIH): 141 mg/dL — ABNORMAL HIGH (ref 0–99)
Triglycerides: 169 mg/dL — ABNORMAL HIGH (ref 0–149)
VLDL Cholesterol Cal: 31 mg/dL (ref 5–40)

## 2024-02-01 ENCOUNTER — Other Ambulatory Visit: Payer: Self-pay

## 2024-02-01 DIAGNOSIS — G40219 Localization-related (focal) (partial) symptomatic epilepsy and epileptic syndromes with complex partial seizures, intractable, without status epilepticus: Secondary | ICD-10-CM

## 2024-02-01 DIAGNOSIS — G40802 Other epilepsy, not intractable, without status epilepticus: Secondary | ICD-10-CM

## 2024-03-01 ENCOUNTER — Other Ambulatory Visit: Payer: Self-pay

## 2024-03-02 ENCOUNTER — Other Ambulatory Visit: Payer: Self-pay

## 2024-03-31 ENCOUNTER — Other Ambulatory Visit: Payer: Self-pay

## 2024-04-01 ENCOUNTER — Other Ambulatory Visit: Payer: Self-pay

## 2024-04-10 ENCOUNTER — Emergency Department (HOSPITAL_COMMUNITY)

## 2024-04-10 ENCOUNTER — Emergency Department (HOSPITAL_COMMUNITY)
Admission: EM | Admit: 2024-04-10 | Discharge: 2024-04-11 | Disposition: A | Discharge status: Home / Self Care | Attending: Emergency Medicine | Admitting: Emergency Medicine

## 2024-04-10 ENCOUNTER — Other Ambulatory Visit: Payer: Self-pay

## 2024-04-10 ENCOUNTER — Encounter (HOSPITAL_COMMUNITY): Payer: Self-pay | Admitting: *Deleted

## 2024-04-10 DIAGNOSIS — R569 Unspecified convulsions: Secondary | ICD-10-CM | POA: Insufficient documentation

## 2024-04-10 DIAGNOSIS — R0789 Other chest pain: Secondary | ICD-10-CM | POA: Insufficient documentation

## 2024-04-10 DIAGNOSIS — Y9366 Activity, soccer: Secondary | ICD-10-CM | POA: Insufficient documentation

## 2024-04-10 DIAGNOSIS — W1839XA Other fall on same level, initial encounter: Secondary | ICD-10-CM | POA: Insufficient documentation

## 2024-04-10 MED ORDER — ACETAMINOPHEN 325 MG PO TABS
ORAL_TABLET | ORAL | Status: AC
  Filled 2024-04-10: qty 2

## 2024-04-10 MED ORDER — ACETAMINOPHEN 325 MG PO TABS
650.0000 mg | ORAL_TABLET | Freq: Once | ORAL | Status: AC
  Administered 2024-04-10: 650 mg via ORAL

## 2024-04-10 NOTE — ED Triage Notes (Signed)
 The pt was playing soccer and he may have had a seizure but family did not witness this he has epilepsy  and he is now hurting in his lt ribs  he had similar problem 2 years ago  and had a brken rib

## 2024-04-11 ENCOUNTER — Other Ambulatory Visit: Payer: Self-pay

## 2024-04-11 MED ORDER — MORPHINE SULFATE 15 MG PO TABS
7.5000 mg | ORAL_TABLET | ORAL | 0 refills | Status: AC | PRN
  Filled 2024-04-11: qty 3, 1d supply, fill #0

## 2024-04-11 NOTE — Discharge Instructions (Signed)
 Use the incentive spirometer 10 minutes out of every hour that you are awake.  Please let your neurologist know about your seizure.  This resets your clock no climbing to tall heights driving a car or swimming by yourself for 6 months Take 4 over the counter ibuprofen tablets 3 times a day or 2 over-the-counter naproxen tablets twice a day for pain. Also take tylenol  1000mg (2 extra strength) four times a day.   Then take the pain medicine if you feel like you need it. Narcotics do not help with the pain, they only make you care about it less.  You can become addicted to this, people may break into your house to steal it.  It will constipate you.  If you drive under the influence of this medicine you can get a DUI.    Use el espirmetro incentivador 10 minutos de cada hora que est despierto.  Por favor, informe a su neurlogo sobre su convulsin. Esto le permitir reiniciar su reloj. No debe subirse a grandes alturas, conducir un coche ni nadar solo durante 6 meses.  Tome 4 tabletas de ibuprofeno de venta libre 3 veces al da o 2 tabletas de naproxeno de venta [address] veces al da para Chief Technology Officer. Tambin tome Tylenol  1000 mg (2 extra fuertes) cuatro veces al [employer].  Despus, tome el analgsico si lo necesita. Los narcticos no Associate Professor, solo hacen que le importe menos. Puede desarrollar adiccin; la gente puede entrar en su casa a robarlo. Le causar estreimiento. Si conduce bajo los efectos de [place], puede recibir una multa por conducir bajo los efectos del alcohol.

## 2024-04-11 NOTE — ED Provider Notes (Signed)
 [name] Provider Note   CSN: [id number] Arrival date & time: 04/10/24  2224     Patient presents with: Fall   Omar Robertson is a 43 y.o. male.   43 yo M with a cc of left-sided chest pain.  Patient was playing soccer today and he collapsed while he was playing.  He does not remember what happened bystanders think maybe he had a breakthrough seizure.  Patient has a history of seizures and states compliance with his medication.  Family says he has a breakthrough seizure maybe once or twice a week.  He is complaining mostly of left-sided chest pain.  Denies headache denies neck pain upper or lower extremity pain.  Denies abdominal pain.  Denies difficulty breathing.  A language interpreter was used.  Fall       Prior to Admission medications   Medication Sig Start Date End Date Taking? Authorizing Provider  morphine  (MSIR) 15 MG tablet Take 0.5 tablets (7.5 mg total) by mouth every 4 (four) hours as needed. 04/11/24  Yes Emil Share, DO  escitalopram  (LEXAPRO ) 20 MG tablet Take 1 tablet (20 mg total) by mouth daily. 01/28/24   Vicci Barnie NOVAK, MD  lacosamide  (VIMPAT ) 200 MG TABS tablet Take 1 tablet (200 mg total) by mouth 2 (two) times daily. 01/20/24   Georjean Darice HERO, MD  levETIRAcetam  (KEPPRA ) 1000 MG tablet Take 2 tablets (2,000 mg total) by mouth in the morning AND 2 tablets (2,000 mg total) every evening. 01/20/24   Georjean Darice HERO, MD  zonisamide  (ZONEGRAN ) 100 MG capsule Take 5 capsules (500 mg total) by mouth Nightly. 01/20/24   Georjean Darice HERO, MD    Allergies: Patient has no known allergies.    Review of Systems  Updated Vital Signs BP (!) 131/90 (BP Location: Right Arm)   Pulse 100   Temp 98.1 F (36.7 C) (Oral)   Resp 18   Ht 5' 9 (1.753 m)   Wt 77.1 kg   SpO2 99%   BMI 25.10 kg/m   Physical Exam Vitals and nursing note reviewed.  Constitutional:      Appearance: He is well-developed.  HENT:      Head: Normocephalic and atraumatic.  Eyes:     Pupils: Pupils are equal, round, and reactive to light.  Neck:     Vascular: No JVD.  Cardiovascular:     Rate and Rhythm: Normal rate and regular rhythm.     Heart sounds: No murmur heard.    No friction rub. No gallop.  Pulmonary:     Effort: No respiratory distress.     Breath sounds: No wheezing.  Abdominal:     General: There is no distension.     Tenderness: There is no abdominal tenderness. There is no guarding or rebound.  Musculoskeletal:        General: Tenderness present. Normal range of motion.     Cervical back: Normal range of motion and neck supple.     Comments: Tenderness about the anterior left rib angles about ribs 6 through 8.  No obvious external signs of trauma.  No obvious intra-abdominal pain.  Skin:    Coloration: Skin is not pale.     Findings: No rash.  Neurological:     Mental Status: He is alert and oriented to person, place, and time.  Psychiatric:        Behavior: Behavior normal.     (all labs ordered are listed,  but only abnormal results are displayed) Labs Reviewed - No data to display  EKG: None  Radiology: DG Ribs Unilateral W/Chest Left Result Date: 04/10/2024 CLINICAL DATA:  Fall while playing soccer with subsequent seizure activity, initial encounter EXAM: LEFT RIBS AND CHEST - 3+ VIEW COMPARISON:  None Available. FINDINGS: Cardiac shadow is within normal limits. No acute rib fracture is noted. Lungs are clear. No pneumothorax is noted. IMPRESSION: No acute rib fracture noted. Electronically Signed   By: Oneil Devonshire M.D.   On: 04/10/2024 23:19     Procedures   Medications Ordered in the ED  acetaminophen  (TYLENOL ) tablet 650 mg (650 mg Oral Given 04/10/24 2243)                                    Medical Decision Making Risk Prescription drug management.   43 yo M with a chief complaint of left-sided chest pain.  The patient fell while playing soccer today.  There was some  thought that maybe had a breakthrough seizure.  He is back to baseline states compliance with his medications.  Plain film of the chest independently interpreted by me without fracture or pneumothorax.  I discussed results with patient and family.  Treat as possible fracture.  Incentive spirometry pain meds.  Notified patient and family the patient is not to drive a car or climb to tall heights or swim for the next 6 months.  12:27 AM:  I have discussed the diagnosis/risks/treatment options with the patient and family.  Evaluation and diagnostic testing in the emergency department does not suggest an emergent condition requiring admission or immediate intervention beyond what has been performed at this time.  They will follow up with PCP. We also discussed returning to the ED immediately if new or worsening sx occur. We discussed the sx which are most concerning (e.g., sudden worsening pain, fever, inability to tolerate by mouth) that necessitate immediate return. Medications administered to the patient during their visit and any new prescriptions provided to the patient are listed below.  Medications given during this visit Medications  acetaminophen  (TYLENOL ) tablet 650 mg (650 mg Oral Given 04/10/24 2243)     The patient appears reasonably screen and/or stabilized for discharge and I doubt any other medical condition or other [facility] requiring further screening, evaluation, or treatment in the ED at this time prior to discharge.       Final diagnoses:  Chest wall pain  Seizure [facility])    ED Discharge Orders          Ordered    morphine  (MSIR) 15 MG tablet  Every 4 hours PRN        04/11/24 0018               Emil Share, DO 04/11/24 9972

## 2024-04-29 ENCOUNTER — Other Ambulatory Visit: Payer: Self-pay

## 2024-05-01 ENCOUNTER — Other Ambulatory Visit: Payer: Self-pay

## 2024-05-03 ENCOUNTER — Other Ambulatory Visit: Payer: Self-pay

## 2024-05-18 ENCOUNTER — Encounter: Payer: Self-pay | Admitting: Neurology

## 2024-05-18 ENCOUNTER — Ambulatory Visit: Admitting: Neurology

## 2024-05-31 ENCOUNTER — Other Ambulatory Visit: Payer: Self-pay

## 2024-06-01 ENCOUNTER — Other Ambulatory Visit: Payer: Self-pay

## 2024-07-02 ENCOUNTER — Other Ambulatory Visit (HOSPITAL_COMMUNITY): Payer: Self-pay

## 2024-07-04 ENCOUNTER — Other Ambulatory Visit: Payer: Self-pay

## 2024-07-05 ENCOUNTER — Other Ambulatory Visit: Payer: Self-pay

## 2024-07-15 ENCOUNTER — Encounter: Payer: Self-pay | Admitting: Neurology

## 2024-07-29 ENCOUNTER — Other Ambulatory Visit: Payer: Self-pay

## 2024-07-29 ENCOUNTER — Other Ambulatory Visit: Payer: Self-pay | Admitting: Neurology

## 2024-07-29 DIAGNOSIS — G40219 Localization-related (focal) (partial) symptomatic epilepsy and epileptic syndromes with complex partial seizures, intractable, without status epilepticus: Secondary | ICD-10-CM

## 2024-08-01 ENCOUNTER — Other Ambulatory Visit: Payer: Self-pay

## 2024-08-01 ENCOUNTER — Ambulatory Visit: Attending: Internal Medicine | Admitting: Internal Medicine

## 2024-08-01 VITALS — BP 118/77 | HR 59 | Temp 97.8°F | Ht 69.0 in | Wt 172.0 lb

## 2024-08-01 DIAGNOSIS — F330 Major depressive disorder, recurrent, mild: Secondary | ICD-10-CM

## 2024-08-01 DIAGNOSIS — G40219 Localization-related (focal) (partial) symptomatic epilepsy and epileptic syndromes with complex partial seizures, intractable, without status epilepticus: Secondary | ICD-10-CM | POA: Diagnosis not present

## 2024-08-01 DIAGNOSIS — Z23 Encounter for immunization: Secondary | ICD-10-CM

## 2024-08-01 DIAGNOSIS — E782 Mixed hyperlipidemia: Secondary | ICD-10-CM

## 2024-08-01 MED ORDER — LACOSAMIDE 200 MG PO TABS
200.0000 mg | ORAL_TABLET | Freq: Two times a day (BID) | ORAL | 1 refills | Status: AC
  Filled 2024-08-01: qty 60, 30d supply, fill #0
  Filled 2024-08-31: qty 60, 30d supply, fill #1
  Filled 2024-09-28 – 2024-09-30 (×2): qty 60, 30d supply, fill #2

## 2024-08-01 NOTE — Patient Instructions (Signed)
  VISIT SUMMARY: You visited today due to recent seizure activity. You experienced three seizures in November, with the most recent one last Friday. These seizures were different from your previous ones as you did not faint or fall. You are taking your medications as prescribed and have a follow-up with your neurologist scheduled for April next year. Your depression is well-controlled with your current medication, and you have received your flu shot.  YOUR PLAN: -LOCALIZATION-RELATED EPILEPSY WITH COMPLEX PARTIAL SEIZURES, INTRACTABLE: This type of epilepsy involves seizures that originate in a specific area of the brain and can cause complex symptoms. You experienced three seizures in November, which were less severe and did not involve loss of consciousness. Continue taking Keppra , lacosamide , and zonisamide  as prescribed. We have contacted your neurologist to try to get an earlier appointment due to your recent seizures and sent a message to inform them about your condition. Blood tests have been ordered to check your liver and kidney function and the levels of your seizure medications.  -MAJOR DEPRESSIVE DISORDER: This is a mental health condition characterized by persistent feelings of sadness and loss of interest. Your depression is well-controlled with your current medication regimen, so no changes are needed at this time.  -MIXED HYPERLIPIDEMIA: This condition involves having high levels of different types of fats in your blood, which can increase the risk of heart disease. Your cholesterol levels were last checked six months ago. Given your reduced food intake, we will continue to monitor your condition.  -GENERAL HEALTH MAINTENANCE: You have received your flu shot. Continue with your routine health maintenance and follow any general lifestyle recommendations provided.  INSTRUCTIONS: Please contact your neurologist to try to get an earlier appointment due to your recent seizures. Follow up with  the blood tests for liver and kidney function and seizure medication levels as ordered. Continue taking your medications as prescribed and maintain your routine health maintenance.                      Contains text generated by Abridge.                                 Contains text generated by Abridge.

## 2024-08-01 NOTE — Progress Notes (Addendum)
 Patient ID: Omar Robertson, male    DOB: June 21, 1981  MRN: [id number]  CC: Hyperlipidemia (Hyperlipidemia f/u. Med refill. /No questions / concerns/Flu vax administered on 08/01/24 - C.A.)   Subjective: Omar Robertson is a 43 y.o. male who presents for chronic ds management. Wife, Omar Robertson, is with him His concerns today include:  Pt with hx of HL, Sz disorder, TBI, MDD, 3 brain cyst removed in [place]   [employer] Language interpreter used during this encounter. # Omar Robertson [phone]   Discussed the use of AI scribe software for clinical note transcription with the patient, who gave verbal consent to proceed.  History of Present Illness   Omar Robertson is a 43 year old male with a seizure disorder who presents with recent seizure activity.  He experienced three seizures in November, with the most recent occurring last Friday. The nature of his seizures has changed; he no longer faints or falls to the ground during episodes, describing them as 'different' from previous ones.  He is currently taking Keppra  2000 mg twice daily, lacosamide  200 mg twice daily, and zonisamide  500 mg at bedtime. He confirms adherence to his medication regimen without missing any doses in November. He does not drive.  He missed an appointment with his neurologist, Dr. Georjean, in September and has a follow-up scheduled for April next year. A brain procedure was scheduled for December 31st but was canceled due to the holidays.  In addition to his seizure disorder, he is taking escitalopram  for depression and reports that his depression is well-controlled with this treatment.  Has hx of hyperlipidemia. He avoids tried foods. He mentions that he is not very hungry and does not eat much. Weight is stable.       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 90 tablet 2   levETIRAcetam  (KEPPRA ) 1000 MG tablet Take 2 tablets (2,000 mg total) by mouth in the morning AND 2 tablets (2,000 mg total) every evening. 120 tablet 11   morphine  (MSIR) 15 MG tablet Take 0.5 tablets (7.5 mg total) by mouth every 4 (four) hours as needed. 3 tablet 0   zonisamide  (ZONEGRAN ) 100 MG capsule Take 5 capsules (500 mg total) by mouth Nightly. 150 capsule 11   No current facility-administered medications on file prior to visit.    No Known Allergies  Social History   Socioeconomic History   Marital status: Media Planner    Spouse name: Not on file   Number of children: Not on file   Years of education: Not on file   Highest education level: Not on file  Occupational History   Not on file  Tobacco Use   Smoking status: Never   Smokeless tobacco: Never  Vaping Use   Vaping status: Never Used  Substance and Sexual Activity   Alcohol use: Not Currently   Drug use: Not Currently   Sexual activity: Not Currently  Other Topics Concern   Not on file  Social History  Narrative   Right handed    Drinks no caffeine    Social Drivers of Corporate Investment Banker Strain: Not on file  Food Insecurity: Not on file  Transportation Needs: Not on file  Physical Activity: Not on file  Stress: Not on file  Social Connections: Not on file  Intimate Partner Violence: Not on file    Family History  Problem Relation Age of Onset   Seizures Neg Hx     Past Surgical History:  Procedure Laterality Date   BRAIN SURGERY      ROS: Review of Systems Negative except as stated above  PHYSICAL EXAM: BP 118/77  (BP Location: Left Arm, Patient Position: Sitting, Cuff Size: Normal)   Pulse (!) 59   Temp 97.8 F (36.6 C) (Oral)   Ht 5' 9 (1.753 m)   Wt 172 lb (78 kg)   SpO2 99%   BMI 25.40 kg/m   Wt Readings from Last 3 Encounters:  08/01/24 172 lb (78 kg)  04/10/24 169 lb 15.6 oz (77.1 kg)  01/28/24 170 lb (77.1 kg)    Physical Exam  General appearance - alert, well appearing, middle age Hispanic male and in no distress Mental status - normal mood, behavior, speech, dress, motor activity, and thought processes Neck - supple, no significant adenopathy Chest - clear to auscultation, no wheezes, rales or rhonchi, symmetric air entry Heart - normal rate, regular rhythm, normal S1, S2, no murmurs, rubs, clicks or gallops Neurological - alert, oriented, normal speech, no focal findings or movement disorder noted, cranial nerves II through XII intact, motor and sensory grossly normal bilaterally, Romberg sign negative, normal gait and station Extremities - peripheral pulses normal, no pedal edema, no clubbing or cyanosis  The 10-year ASCVD risk score (Arnett DK, et al., 2019) is: 2.8%   Values used to calculate the score:     Age: 40 years     Clincally relevant sex: Male     Is Non-Hispanic African American: No     Diabetic: No     Tobacco smoker: No     Systolic Blood Pressure: 118 mmHg     Is BP treated: No     HDL Cholesterol: 33 mg/dL     Total Cholesterol: 205 mg/dL     87/04/7973    0:75 AM 07/29/2023    9:06 AM 03/25/2023    9:08 AM  Depression screen PHQ 2/9  Decreased Interest 2 0 1  Down, Depressed, Hopeless 0 0 1  PHQ - 2 Score 2 0 2  Altered sleeping 2 0 1  Tired, decreased energy 2 0 1  Change in appetite 3 2 1   Feeling bad or failure about yourself  0 0 2  Trouble concentrating 2 0 1  Moving slowly or fidgety/restless 0 0 1  Suicidal thoughts 0 0 1  PHQ-9 Score 11 2  10    Difficult doing work/chores Somewhat difficult Not difficult at all      Data saved with a  previous flowsheet row definition       Latest Ref Rng & Units 01/28/2024    9:09 AM 07/29/2023    9:54 AM 11/20/2022   10:51 AM  CMP  Glucose 70 - 99 mg/dL 899  93  93   BUN 6 - 24 mg/dL 18  20  22    Creatinine 0.76 - 1.27 mg/dL 8.82  8.90  8.92   Sodium 134 - 144 mmol/L 143  143  143   Potassium  3.5 - 5.2 mmol/L 4.1  4.4  4.9   Chloride 96 - 106 mmol/L 108  108  110   CO2 20 - 29 mmol/L 17  20  20    Calcium  8.7 - 10.2 mg/dL 9.2  9.2  9.5   Total Protein 6.0 - 8.5 g/dL 7.4  7.6    Total Bilirubin 0.0 - 1.2 mg/dL 0.3  0.2    Alkaline Phos 44 - 121 IU/L 70  70    AST 0 - 40 IU/L 16  18    ALT 0 - 44 IU/L 22  23     Lipid Panel     Component Value Date/Time   CHOL 205 (H) 01/28/2024 0909   TRIG 169 (H) 01/28/2024 0909   HDL 33 (L) 01/28/2024 0909   CHOLHDL 6.2 (H) 01/28/2024 0909   LDLCALC 141 (H) 01/28/2024 0909    CBC    Component Value Date/Time   WBC 6.6 01/28/2024 0909   WBC 8.0 10/29/2022 0345   RBC 5.10 01/28/2024 0909   RBC 4.28 10/29/2022 0345   HGB 14.1 01/28/2024 0909   HCT 44.2 01/28/2024 0909   PLT 280 01/28/2024 0909   MCV 87 01/28/2024 0909   MCH 27.6 01/28/2024 0909   MCH 27.8 10/29/2022 0345   MCHC 31.9 01/28/2024 0909   MCHC 32.9 10/29/2022 0345   RDW 12.9 01/28/2024 0909   LYMPHSABS 2.3 07/29/2023 0954   MONOABS 0.9 10/28/2022 0020   EOSABS 0.2 07/29/2023 0954   BASOSABS 0.0 07/29/2023 0954    ASSESSMENT AND PLAN: 1. Localization-related (focal) (partial) symptomatic epilepsy and epileptic syndromes with complex partial seizures, intractable, without status epilepticus (HCC) (Primary) Patient with 3 seizures in the last month.  He had missed his appointment with the neurologist the next appointment is not until April of next year.  I advised patient and his wife to call the neurologist office and let them know that he has had 3 seizures despite compliance with medications and request to be seen sooner.  I will also send a message to her and will  check his seizure medication blood levels. Advised that in the state of [place]  patients with seizures should not drive until they are seizure-free for at least 6 to 12 months.  He expresses understanding of this and states that he does not drive. - lacosamide  (VIMPAT ) 200 MG TABS tablet; Take 1 tablet (200 mg total) by mouth 2 (two) times daily.  Dispense: 180 tablet; Refill: 1 - CBC - Comprehensive metabolic panel with GFR - Levetiracetam  level - Lacosamide  - Zonisamide  level  2. Mild episode of recurrent major depressive disorder PHQ-9 score higher today but patient reports he is still doing well on Lexapro .  3. Mixed hyperlipidemia Encouraged to continue healthy eating habits.  4. Need for immunization against influenza - Flu vaccine trivalent PF, 6mos and older(Flulaval,Afluria,Fluarix,Fluzone)  Patient was given the opportunity to ask questions.  Patient verbalized understanding of the plan and was able to repeat key elements of the plan.   This documentation was completed using Paediatric nurse.  Any transcriptional errors are unintentional.  Orders Placed This Encounter  Procedures   Flu vaccine trivalent PF, 6mos and older(Flulaval,Afluria,Fluarix,Fluzone)   CBC   Comprehensive metabolic panel with GFR   Levetiracetam  level   Lacosamide    Zonisamide  level     Requested Prescriptions   Signed Prescriptions Disp Refills   lacosamide  (VIMPAT ) 200 MG TABS tablet 180 tablet 1    Sig: Take 1  tablet (200 mg total) by mouth 2 (two) times daily.    Return in about 6 months (around 01/30/2025).  Barnie Louder, MD, FACP

## 2024-08-03 ENCOUNTER — Ambulatory Visit: Payer: Self-pay | Admitting: Internal Medicine

## 2024-08-03 ENCOUNTER — Telehealth: Payer: Self-pay | Admitting: Internal Medicine

## 2024-08-03 NOTE — Telephone Encounter (Signed)
-----   Message from Darice CHRISTELLA Shivers sent at 08/02/2024 12:46 PM EST ----- Regarding: RE: Seizures Thanks for the update! He was referred to [facility] for presurgical evaluation, we will check on the referral. I will have staff get him in before April, thanks  Darice ----- Message ----- From: Vicci Barnie NOVAK, MD Sent: 08/01/2024  10:34 AM EST To: Darice CHRISTELLA Shivers, MD Subject: Seizures                                       I am the PCP for this patient.  He sees you for seizure disorder.  He missed his last appointment with you in September and has been rescheduled for April of next year. I saw him today for routine follow-up visit.  He has had 3 seizures during the month of November despite compliance with his medications that include Vimpat , Keppra  and zonisamide  500 mg at bedtime.  He tells me he was supposed to have some type of brain procedure this month but it was canceled due to the upcoming holidays.  I drew blood to check the levels of all 3 of his seizure medications.  Is there any way you can get him in before April?

## 2024-08-04 ENCOUNTER — Telehealth: Payer: Self-pay | Admitting: Neurology

## 2024-08-04 ENCOUNTER — Other Ambulatory Visit: Payer: Self-pay

## 2024-08-04 LAB — LACOSAMIDE: Lacosamide: 6.6 ug/mL (ref 5.0–10.0)

## 2024-08-04 LAB — CBC
Hematocrit: 43.6 % (ref 37.5–51.0)
Hemoglobin: 13.9 g/dL (ref 13.0–17.7)
MCH: 27.7 pg (ref 26.6–33.0)
MCHC: 31.9 g/dL (ref 31.5–35.7)
MCV: 87 fL (ref 79–97)
Platelets: 297 x10E3/uL (ref 150–450)
RBC: 5.01 x10E6/uL (ref 4.14–5.80)
RDW: 13.1 % (ref 11.6–15.4)
WBC: 7.3 x10E3/uL (ref 3.4–10.8)

## 2024-08-04 LAB — COMPREHENSIVE METABOLIC PANEL WITH GFR
ALT: 22 IU/L (ref 0–44)
AST: 18 IU/L (ref 0–40)
Albumin: 4.5 g/dL (ref 4.1–5.1)
Alkaline Phosphatase: 71 IU/L (ref 47–123)
BUN/Creatinine Ratio: 12 (ref 9–20)
BUN: 16 mg/dL (ref 6–24)
Bilirubin Total: <0.2 mg/dL (ref 0.0–1.2)
CO2: 21 mmol/L (ref 20–29)
Calcium: 9.6 mg/dL (ref 8.7–10.2)
Chloride: 108 mmol/L — ABNORMAL HIGH (ref 96–106)
Creatinine, Ser: 1.29 mg/dL — ABNORMAL HIGH (ref 0.76–1.27)
Globulin, Total: 2.8 g/dL (ref 1.5–4.5)
Glucose: 93 mg/dL (ref 70–99)
Potassium: 4.8 mmol/L (ref 3.5–5.2)
Sodium: 142 mmol/L (ref 134–144)
Total Protein: 7.3 g/dL (ref 6.0–8.5)
eGFR: 70 mL/min/1.73 (ref >59)

## 2024-08-04 LAB — ZONISAMIDE LEVEL: Zonisamide: 28.6 ug/mL (ref 10.0–40.0)

## 2024-08-04 LAB — LEVETIRACETAM LEVEL: Levetiracetam Lvl: 37.6 ug/mL (ref 10.0–40.0)

## 2024-08-04 NOTE — Telephone Encounter (Signed)
 LMOM  for patient to call back to see if he could come in to see Dr Georjean on  08-10-24

## 2024-08-07 ENCOUNTER — Telehealth: Payer: Self-pay | Admitting: Internal Medicine

## 2024-08-07 NOTE — Telephone Encounter (Signed)
-----   Message from Darice CHRISTELLA Shivers sent at 08/05/2024  9:31 AM EST ----- Regarding: RE: Seizures Just wanted to keep you in the loop, our office has called him several times this week for openings this week and next week, but no response yet.  Have a good weekend! Darice ----- Message ----- From: Vicci Barnie NOVAK, MD Sent: 08/01/2024  10:34 AM EST To: Darice CHRISTELLA Shivers, MD Subject: Seizures                                       I am the PCP for this patient.  He sees you for seizure disorder.  He missed his last appointment with you in September and has been rescheduled for April of next year. I saw him today for routine follow-up visit.  He has had 3 seizures during the month of November despite compliance with his medications that include Vimpat , Keppra  and zonisamide  500 mg at bedtime.  He tells me he was supposed to have some type of brain procedure this month but it was canceled due to the upcoming holidays.  I drew blood to check the levels of all 3 of his seizure medications.  Is there any way you can get him in before April?

## 2024-08-29 ENCOUNTER — Other Ambulatory Visit: Payer: Self-pay

## 2024-08-30 ENCOUNTER — Other Ambulatory Visit: Payer: Self-pay

## 2024-08-31 ENCOUNTER — Other Ambulatory Visit: Payer: Self-pay

## 2024-09-02 ENCOUNTER — Other Ambulatory Visit: Payer: Self-pay

## 2024-09-28 ENCOUNTER — Other Ambulatory Visit: Payer: Self-pay

## 2024-09-29 ENCOUNTER — Other Ambulatory Visit: Payer: Self-pay

## 2024-09-30 ENCOUNTER — Other Ambulatory Visit: Payer: Self-pay

## 2024-10-13 ENCOUNTER — Ambulatory Visit: Payer: Self-pay | Admitting: Internal Medicine

## 2024-12-21 ENCOUNTER — Ambulatory Visit: Admitting: Neurology

## 2025-01-30 ENCOUNTER — Ambulatory Visit: Admitting: Internal Medicine
